# Patient Record
Sex: Female | Born: 1980 | Race: Black or African American | Hispanic: No | Marital: Single | Smoking: Never smoker
Health system: Southern US, Community
[De-identification: ages and names within clinical notes are randomized; demographics above are authoritative.]

---

## 2003-03-27 ENCOUNTER — Emergency Department (HOSPITAL_COMMUNITY): Admission: EM | Admit: 2003-03-27 | Discharge: 2003-03-27 | Payer: Self-pay | Admitting: Emergency Medicine

## 2003-03-28 ENCOUNTER — Encounter: Payer: Self-pay | Admitting: Emergency Medicine

## 2003-03-28 ENCOUNTER — Emergency Department (HOSPITAL_COMMUNITY): Admission: EM | Admit: 2003-03-28 | Discharge: 2003-03-28 | Payer: Self-pay | Admitting: Emergency Medicine

## 2003-10-15 ENCOUNTER — Emergency Department (HOSPITAL_COMMUNITY): Admission: EM | Admit: 2003-10-15 | Discharge: 2003-10-15 | Payer: Self-pay | Admitting: Emergency Medicine

## 2003-10-15 ENCOUNTER — Encounter: Payer: Self-pay | Admitting: Emergency Medicine

## 2004-03-19 ENCOUNTER — Emergency Department (HOSPITAL_COMMUNITY): Admission: EM | Admit: 2004-03-19 | Discharge: 2004-03-19 | Payer: Self-pay | Admitting: Emergency Medicine

## 2005-02-25 ENCOUNTER — Emergency Department (HOSPITAL_COMMUNITY): Admission: EM | Admit: 2005-02-25 | Discharge: 2005-02-25 | Payer: Self-pay | Admitting: Emergency Medicine

## 2005-09-05 ENCOUNTER — Emergency Department (HOSPITAL_COMMUNITY): Admission: EM | Admit: 2005-09-05 | Discharge: 2005-09-05 | Payer: Self-pay | Admitting: Emergency Medicine

## 2005-11-17 ENCOUNTER — Emergency Department (HOSPITAL_COMMUNITY): Admission: EM | Admit: 2005-11-17 | Discharge: 2005-11-17 | Payer: Self-pay | Admitting: Emergency Medicine

## 2006-01-20 ENCOUNTER — Emergency Department (HOSPITAL_COMMUNITY): Admission: EM | Admit: 2006-01-20 | Discharge: 2006-01-21 | Payer: Self-pay | Admitting: Emergency Medicine

## 2006-01-20 ENCOUNTER — Emergency Department (HOSPITAL_COMMUNITY): Admission: EM | Admit: 2006-01-20 | Discharge: 2006-01-20 | Payer: Self-pay | Admitting: Emergency Medicine

## 2006-01-22 ENCOUNTER — Inpatient Hospital Stay (HOSPITAL_COMMUNITY): Admission: AD | Admit: 2006-01-22 | Discharge: 2006-01-25 | Payer: Self-pay | Admitting: Internal Medicine

## 2006-01-27 ENCOUNTER — Inpatient Hospital Stay (HOSPITAL_COMMUNITY): Admission: EM | Admit: 2006-01-27 | Discharge: 2006-01-29 | Payer: Self-pay | Admitting: Emergency Medicine

## 2006-01-28 ENCOUNTER — Encounter: Payer: Self-pay | Admitting: Internal Medicine

## 2006-01-30 ENCOUNTER — Inpatient Hospital Stay (HOSPITAL_COMMUNITY): Admission: EM | Admit: 2006-01-30 | Discharge: 2006-02-03 | Payer: Self-pay | Admitting: Emergency Medicine

## 2006-01-31 ENCOUNTER — Encounter (INDEPENDENT_AMBULATORY_CARE_PROVIDER_SITE_OTHER): Payer: Self-pay | Admitting: Specialist

## 2006-09-02 IMAGING — CR DG ABD PORTABLE 1V
1 series · 1 of 1 positions shown · non-contrast
Comparison: [HOSPITAL] abdominal CT dated 01/28/06.

CLINICAL DATA: Nausea, vomiting, and nasogastric tube placement.
PORTABLE ABDOMEN ? 1 VIEW ? 01/30/06:

[view not recorded]
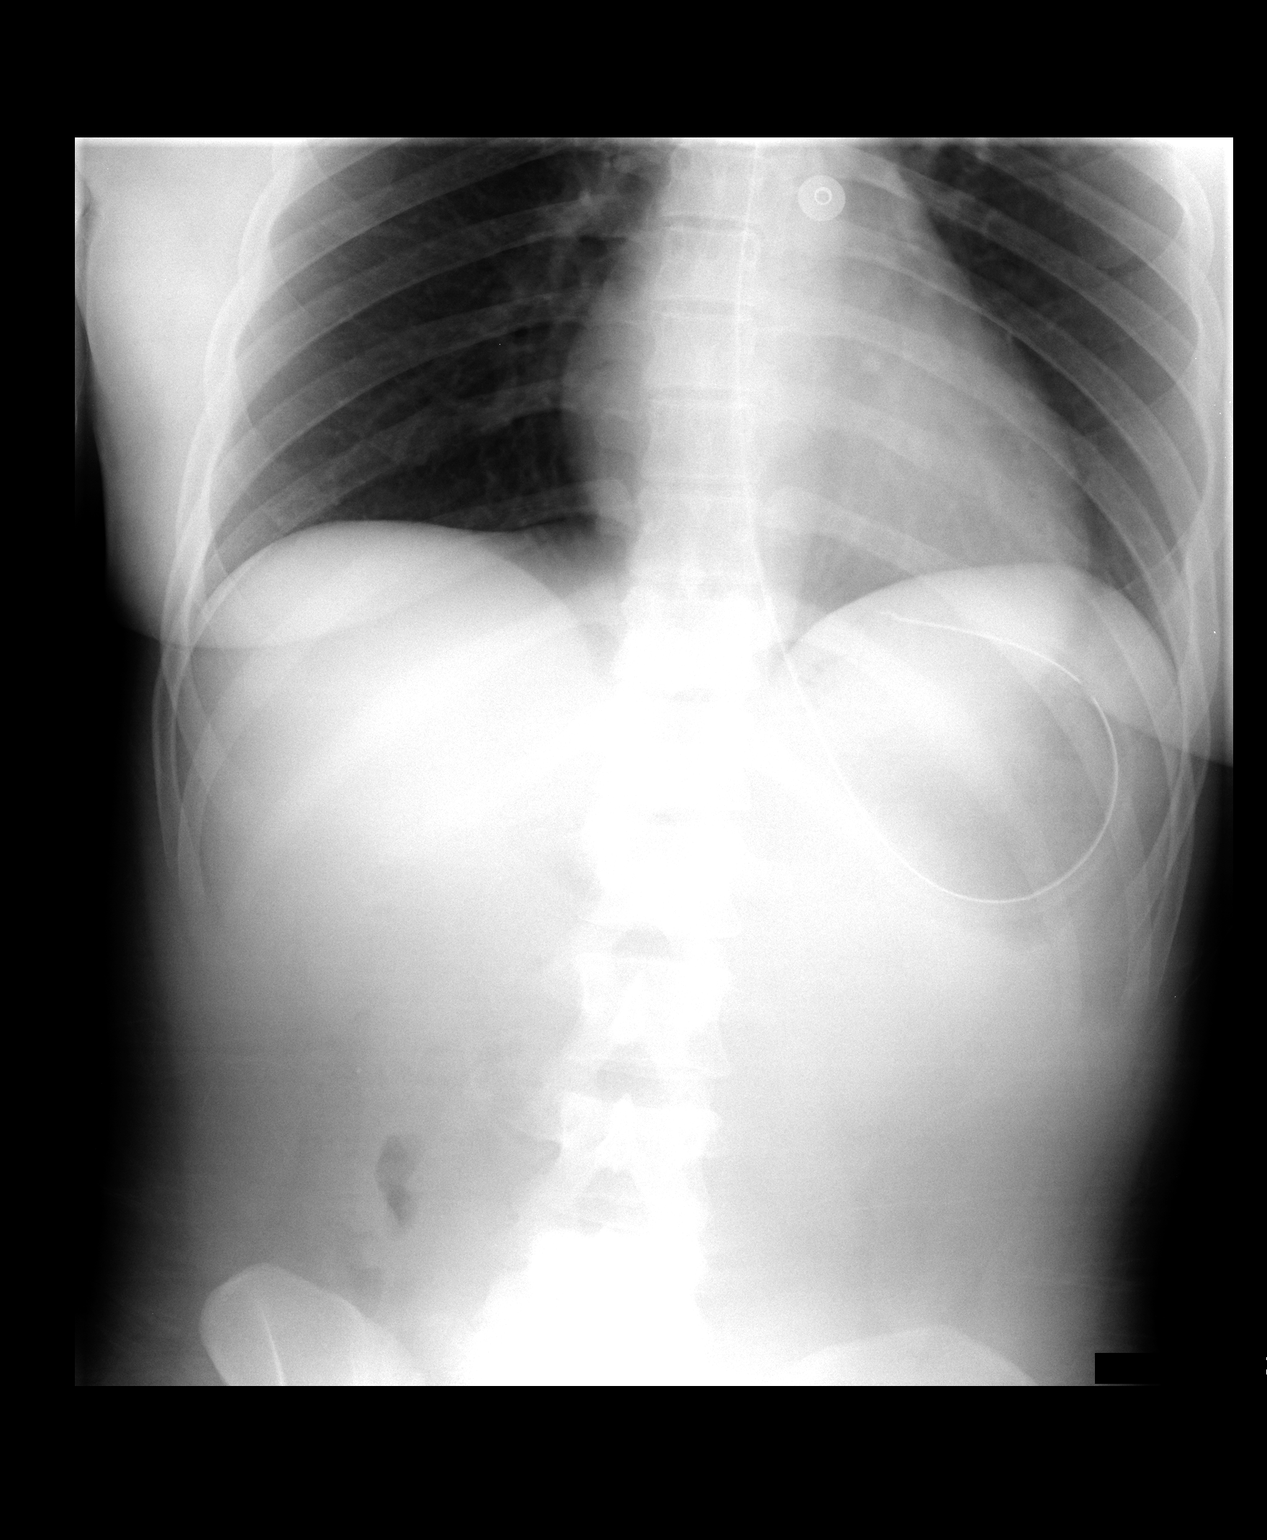

[1 of 1 positions shown; findings below may reference images not displayed]

FINDINGS: A nasogastric tube is seen entering the stomach with loop and ending at gastric cardia.  No other significant abnormality is seen.
IMPRESSION: Nasogastric tube ending in stomach.

## 2006-09-04 ENCOUNTER — Emergency Department (HOSPITAL_COMMUNITY): Admission: EM | Admit: 2006-09-04 | Discharge: 2006-09-04 | Payer: Self-pay | Admitting: Emergency Medicine

## 2006-09-06 ENCOUNTER — Emergency Department (HOSPITAL_COMMUNITY): Admission: EM | Admit: 2006-09-06 | Discharge: 2006-09-06 | Payer: Self-pay | Admitting: Emergency Medicine

## 2006-09-06 ENCOUNTER — Emergency Department (HOSPITAL_COMMUNITY): Admission: EM | Admit: 2006-09-06 | Discharge: 2006-09-07 | Payer: Self-pay | Admitting: *Deleted

## 2007-02-17 ENCOUNTER — Emergency Department (HOSPITAL_COMMUNITY): Admission: EM | Admit: 2007-02-17 | Discharge: 2007-02-17 | Payer: Self-pay | Admitting: Emergency Medicine

## 2008-01-14 ENCOUNTER — Emergency Department (HOSPITAL_COMMUNITY): Admission: EM | Admit: 2008-01-14 | Discharge: 2008-01-14 | Payer: Self-pay | Admitting: Emergency Medicine

## 2008-04-15 ENCOUNTER — Emergency Department (HOSPITAL_COMMUNITY): Admission: EM | Admit: 2008-04-15 | Discharge: 2008-04-15 | Payer: Self-pay | Admitting: Emergency Medicine

## 2008-06-18 ENCOUNTER — Emergency Department (HOSPITAL_COMMUNITY): Admission: EM | Admit: 2008-06-18 | Discharge: 2008-06-18 | Payer: Self-pay | Admitting: Emergency Medicine

## 2008-06-19 ENCOUNTER — Emergency Department (HOSPITAL_COMMUNITY): Admission: EM | Admit: 2008-06-19 | Discharge: 2008-06-19 | Payer: Self-pay | Admitting: Emergency Medicine

## 2008-06-23 ENCOUNTER — Emergency Department (HOSPITAL_COMMUNITY): Admission: EM | Admit: 2008-06-23 | Discharge: 2008-06-23 | Payer: Self-pay | Admitting: Emergency Medicine

## 2008-09-23 ENCOUNTER — Emergency Department (HOSPITAL_COMMUNITY): Admission: EM | Admit: 2008-09-23 | Discharge: 2008-09-23 | Payer: Self-pay | Admitting: Emergency Medicine

## 2009-01-02 ENCOUNTER — Inpatient Hospital Stay (HOSPITAL_COMMUNITY): Admission: EM | Admit: 2009-01-02 | Discharge: 2009-01-03 | Payer: Self-pay | Admitting: Emergency Medicine

## 2009-03-19 ENCOUNTER — Emergency Department (HOSPITAL_COMMUNITY): Admission: EM | Admit: 2009-03-19 | Discharge: 2009-03-19 | Payer: Self-pay | Admitting: Emergency Medicine

## 2009-03-30 ENCOUNTER — Emergency Department (HOSPITAL_COMMUNITY): Admission: EM | Admit: 2009-03-30 | Discharge: 2009-03-30 | Payer: Self-pay | Admitting: Emergency Medicine

## 2009-12-16 ENCOUNTER — Emergency Department (HOSPITAL_COMMUNITY): Admission: EM | Admit: 2009-12-16 | Discharge: 2009-12-16 | Payer: Self-pay | Admitting: Emergency Medicine

## 2011-01-20 ENCOUNTER — Encounter: Payer: Self-pay | Admitting: Internal Medicine

## 2011-03-30 ENCOUNTER — Emergency Department (HOSPITAL_COMMUNITY): Payer: Self-pay

## 2011-03-30 ENCOUNTER — Emergency Department (HOSPITAL_COMMUNITY)
Admission: EM | Admit: 2011-03-30 | Discharge: 2011-03-30 | Disposition: A | Discharge status: Home / Self Care | Payer: Self-pay | Attending: Emergency Medicine | Admitting: Emergency Medicine

## 2011-03-30 DIAGNOSIS — R109 Unspecified abdominal pain: Secondary | ICD-10-CM | POA: Insufficient documentation

## 2011-03-30 DIAGNOSIS — R51 Headache: Secondary | ICD-10-CM | POA: Insufficient documentation

## 2011-03-30 DIAGNOSIS — N390 Urinary tract infection, site not specified: Secondary | ICD-10-CM | POA: Insufficient documentation

## 2011-03-30 DIAGNOSIS — R112 Nausea with vomiting, unspecified: Secondary | ICD-10-CM | POA: Insufficient documentation

## 2011-03-30 LAB — URINE MICROSCOPIC-ADD ON

## 2011-03-30 LAB — COMPREHENSIVE METABOLIC PANEL
ALT: 23 U/L (ref 0–35)
Calcium: 9.7 mg/dL (ref 8.4–10.5)
Creatinine, Ser: 0.92 mg/dL (ref 0.4–1.2)
Glucose, Bld: 113 mg/dL — ABNORMAL HIGH (ref 70–99)
Sodium: 140 mEq/L (ref 135–145)
Total Protein: 7.7 g/dL (ref 6.0–8.3)

## 2011-03-30 LAB — DIFFERENTIAL
Basophils Absolute: 0 10*3/uL (ref 0.0–0.1)
Neutro Abs: 2.3 10*3/uL (ref 1.7–7.7)
Neutrophils Relative %: 53 % (ref 43–77)

## 2011-03-30 LAB — URINALYSIS, ROUTINE W REFLEX MICROSCOPIC
Nitrite: NEGATIVE
Specific Gravity, Urine: 1.02 (ref 1.005–1.030)
Urobilinogen, UA: 0.2 mg/dL (ref 0.0–1.0)
pH: 8 (ref 5.0–8.0)

## 2011-03-30 LAB — CBC
MCHC: 32.7 g/dL (ref 30.0–36.0)
MCV: 89.5 fL (ref 78.0–100.0)
RDW: 12.9 % (ref 11.5–15.5)
WBC: 4.3 10*3/uL (ref 4.0–10.5)

## 2011-03-30 LAB — LIPASE, BLOOD: Lipase: 16 U/L (ref 11–59)

## 2011-04-01 LAB — DIFFERENTIAL
Basophils Relative: 3 % — ABNORMAL HIGH (ref 0–1)
Lymphs Abs: 1.5 10*3/uL (ref 0.7–4.0)
Monocytes Absolute: 0.4 10*3/uL (ref 0.1–1.0)
Monocytes Relative: 7 % (ref 3–12)
Neutro Abs: 3.8 10*3/uL (ref 1.7–7.7)
Neutrophils Relative %: 65 % (ref 43–77)

## 2011-04-01 LAB — CBC
HCT: 39.8 % (ref 36.0–46.0)
MCHC: 33.4 g/dL (ref 30.0–36.0)
Platelets: 237 10*3/uL (ref 150–400)
RDW: 11.9 % (ref 11.5–15.5)

## 2011-04-01 LAB — COMPREHENSIVE METABOLIC PANEL
Albumin: 4.3 g/dL (ref 3.5–5.2)
Alkaline Phosphatase: 49 U/L (ref 39–117)
BUN: 12 mg/dL (ref 6–23)
Calcium: 9.5 mg/dL (ref 8.4–10.5)
Glucose, Bld: 156 mg/dL — ABNORMAL HIGH (ref 70–99)
Potassium: 3.7 mEq/L (ref 3.5–5.1)
Sodium: 144 mEq/L (ref 135–145)
Total Protein: 7.7 g/dL (ref 6.0–8.3)

## 2011-04-01 LAB — URINE CULTURE

## 2011-04-01 LAB — URINE MICROSCOPIC-ADD ON

## 2011-04-01 LAB — POCT PREGNANCY, URINE: Preg Test, Ur: NEGATIVE

## 2011-04-01 LAB — URINALYSIS, ROUTINE W REFLEX MICROSCOPIC
Glucose, UA: NEGATIVE mg/dL
Leukocytes, UA: NEGATIVE
Nitrite: NEGATIVE
pH: 7.5 (ref 5.0–8.0)

## 2011-04-10 LAB — COMPREHENSIVE METABOLIC PANEL
ALT: 31 U/L (ref 0–35)
Alkaline Phosphatase: 50 U/L (ref 39–117)
BUN: 11 mg/dL (ref 6–23)
Chloride: 112 mEq/L (ref 96–112)
Glucose, Bld: 119 mg/dL — ABNORMAL HIGH (ref 70–99)
Potassium: 3.8 mEq/L (ref 3.5–5.1)
Sodium: 142 mEq/L (ref 135–145)
Total Bilirubin: 0.6 mg/dL (ref 0.3–1.2)
Total Protein: 6.5 g/dL (ref 6.0–8.3)

## 2011-04-10 LAB — URINALYSIS, ROUTINE W REFLEX MICROSCOPIC
Glucose, UA: NEGATIVE mg/dL
Nitrite: NEGATIVE
Specific Gravity, Urine: 1.021 (ref 1.005–1.030)
pH: 7 (ref 5.0–8.0)

## 2011-04-10 LAB — CBC
HCT: 37.8 % (ref 36.0–46.0)
Hemoglobin: 12.7 g/dL (ref 12.0–15.0)
RBC: 4.17 MIL/uL (ref 3.87–5.11)
RDW: 12.2 % (ref 11.5–15.5)
WBC: 7.3 10*3/uL (ref 4.0–10.5)

## 2011-04-10 LAB — DIFFERENTIAL
Basophils Absolute: 0.2 10*3/uL — ABNORMAL HIGH (ref 0.0–0.1)
Basophils Relative: 3 % — ABNORMAL HIGH (ref 0–1)
Eosinophils Absolute: 0 10*3/uL (ref 0.0–0.7)
Monocytes Absolute: 0.5 10*3/uL (ref 0.1–1.0)
Monocytes Relative: 6 % (ref 3–12)
Neutrophils Relative %: 69 % (ref 43–77)

## 2011-04-10 LAB — PREGNANCY, URINE: Preg Test, Ur: NEGATIVE

## 2011-04-11 LAB — DIFFERENTIAL
Basophils Absolute: 0 K/uL (ref 0.0–0.1)
Basophils Relative: 0 % (ref 0–1)
Eosinophils Absolute: 0 K/uL (ref 0.0–0.7)
Eosinophils Relative: 0 % (ref 0–5)
Lymphocytes Relative: 24 % (ref 12–46)
Lymphs Abs: 1.6 K/uL (ref 0.7–4.0)
Monocytes Absolute: 0.6 K/uL (ref 0.1–1.0)
Monocytes Relative: 9 % (ref 3–12)
Neutro Abs: 4.5 K/uL (ref 1.7–7.7)
Neutrophils Relative %: 67 % (ref 43–77)

## 2011-04-11 LAB — CBC
HCT: 41.6 % (ref 36.0–46.0)
Hemoglobin: 13.8 g/dL (ref 12.0–15.0)
MCHC: 33.1 g/dL (ref 30.0–36.0)
MCV: 92 fL (ref 78.0–100.0)
Platelets: 223 K/uL (ref 150–400)
RBC: 4.52 MIL/uL (ref 3.87–5.11)
RDW: 12.9 % (ref 11.5–15.5)
WBC: 6.8 K/uL (ref 4.0–10.5)

## 2011-04-11 LAB — COMPREHENSIVE METABOLIC PANEL WITH GFR
ALT: 25 U/L (ref 0–35)
AST: 32 U/L (ref 0–37)
Alkaline Phosphatase: 60 U/L (ref 39–117)
CO2: 24 meq/L (ref 19–32)
Calcium: 9.8 mg/dL (ref 8.4–10.5)
Chloride: 103 meq/L (ref 96–112)
GFR calc non Af Amer: >60 mL/min (ref >60)
Glucose, Bld: 112 mg/dL — ABNORMAL HIGH (ref 70–99)
Potassium: 3.8 meq/L (ref 3.5–5.1)
Sodium: 138 meq/L (ref 135–145)
Total Bilirubin: 0.5 mg/dL (ref 0.3–1.2)

## 2011-04-11 LAB — URINALYSIS, ROUTINE W REFLEX MICROSCOPIC
Bilirubin Urine: NEGATIVE
Glucose, UA: NEGATIVE mg/dL
Hgb urine dipstick: NEGATIVE
Ketones, ur: NEGATIVE mg/dL
Leukocytes, UA: NEGATIVE
Nitrite: NEGATIVE
Protein, ur: 30 mg/dL — AB
Specific Gravity, Urine: 1.022 (ref 1.005–1.030)
Urobilinogen, UA: 0.2 mg/dL (ref 0.0–1.0)
pH: 8.5 — ABNORMAL HIGH (ref 5.0–8.0)

## 2011-04-11 LAB — COMPREHENSIVE METABOLIC PANEL
Albumin: 4.6 g/dL (ref 3.5–5.2)
BUN: 8 mg/dL (ref 6–23)
Creatinine, Ser: 0.91 mg/dL (ref 0.4–1.2)
GFR calc Af Amer: >60 mL/min (ref >60)
Total Protein: 8 g/dL (ref 6.0–8.3)

## 2011-04-11 LAB — LIPASE, BLOOD: Lipase: 65 U/L — ABNORMAL HIGH (ref 11–59)

## 2011-04-11 LAB — URINE MICROSCOPIC-ADD ON

## 2011-04-11 LAB — PREGNANCY, URINE: Preg Test, Ur: NEGATIVE

## 2011-04-15 LAB — COMPREHENSIVE METABOLIC PANEL
ALT: 43 U/L — ABNORMAL HIGH (ref 0–35)
AST: 31 U/L (ref 0–37)
Albumin: 5.1 g/dL (ref 3.5–5.2)
Calcium: 10 mg/dL (ref 8.4–10.5)
Creatinine, Ser: 0.93 mg/dL (ref 0.4–1.2)
GFR calc Af Amer: >60 mL/min (ref >60)
GFR calc non Af Amer: >60 mL/min (ref >60)
Sodium: 140 mEq/L (ref 135–145)
Total Protein: 8.4 g/dL — ABNORMAL HIGH (ref 6.0–8.3)

## 2011-04-15 LAB — PHOSPHORUS: Phosphorus: 2.9 mg/dL (ref 2.3–4.6)

## 2011-04-15 LAB — DIFFERENTIAL
Eosinophils Absolute: 0 10*3/uL (ref 0.0–0.7)
Eosinophils Relative: 0 % (ref 0–5)
Lymphocytes Relative: 12 % (ref 12–46)
Lymphs Abs: 1 10*3/uL (ref 0.7–4.0)
Monocytes Relative: 11 % (ref 3–12)

## 2011-04-15 LAB — BASIC METABOLIC PANEL
BUN: 16 mg/dL (ref 6–23)
BUN: 16 mg/dL (ref 6–23)
CO2: 25 mEq/L (ref 19–32)
CO2: 26 mEq/L (ref 19–32)
Calcium: 8.4 mg/dL (ref 8.4–10.5)
Chloride: 104 mEq/L (ref 96–112)
Glucose, Bld: 85 mg/dL (ref 70–99)
Glucose, Bld: 90 mg/dL (ref 70–99)
Potassium: 2.9 mEq/L — ABNORMAL LOW (ref 3.5–5.1)
Sodium: 138 mEq/L (ref 135–145)
Sodium: 140 mEq/L (ref 135–145)

## 2011-04-15 LAB — CBC
HCT: 37.3 % (ref 36.0–46.0)
Hemoglobin: 12 g/dL (ref 12.0–15.0)
MCHC: 32.3 g/dL (ref 30.0–36.0)
MCHC: 32.8 g/dL (ref 30.0–36.0)
MCV: 91.8 fL (ref 78.0–100.0)
Platelets: 159 10*3/uL (ref 150–400)
Platelets: 192 10*3/uL (ref 150–400)
RBC: 4.07 MIL/uL (ref 3.87–5.11)
RBC: 4.68 MIL/uL (ref 3.87–5.11)
RDW: 12.5 % (ref 11.5–15.5)
RDW: 13.5 % (ref 11.5–15.5)

## 2011-04-15 LAB — URINALYSIS, ROUTINE W REFLEX MICROSCOPIC
Bilirubin Urine: NEGATIVE
Glucose, UA: NEGATIVE mg/dL
Ketones, ur: >80 mg/dL — AB
Leukocytes, UA: NEGATIVE
Nitrite: NEGATIVE
Specific Gravity, Urine: 1.031 — ABNORMAL HIGH (ref 1.005–1.030)
pH: 7 (ref 5.0–8.0)

## 2011-04-15 LAB — CROSSMATCH
ABO/RH(D): A POS
Antibody Screen: NEGATIVE

## 2011-04-15 LAB — URINE MICROSCOPIC-ADD ON

## 2011-04-15 LAB — CULTURE, BLOOD (ROUTINE X 2): Culture: NO GROWTH

## 2011-04-15 LAB — RAPID URINE DRUG SCREEN, HOSP PERFORMED
Amphetamines: NOT DETECTED
Benzodiazepines: NOT DETECTED

## 2011-04-15 LAB — CARDIAC PANEL(CRET KIN+CKTOT+MB+TROPI)
CK, MB: 0.6 ng/mL (ref 0.3–4.0)
Total CK: 60 U/L (ref 7–177)
Total CK: 65 U/L (ref 7–177)
Troponin I: <0.01 ng/mL (ref 0.00–0.06)

## 2011-04-15 LAB — GC/CHLAMYDIA PROBE AMP, URINE: GC Probe Amp, Urine: NEGATIVE

## 2011-04-15 LAB — CK TOTAL AND CKMB (NOT AT ARMC)
CK, MB: 0.6 ng/mL (ref 0.3–4.0)
Relative Index: INVALID (ref 0.0–2.5)
Total CK: 79 U/L (ref 7–177)

## 2011-04-15 LAB — HIV ANTIBODY (ROUTINE TESTING W REFLEX): HIV: NONREACTIVE

## 2011-04-15 LAB — TROPONIN I: Troponin I: 0.01 ng/mL (ref 0.00–0.06)

## 2011-05-14 NOTE — H&P (Signed)
Marie George, Marie George              ACCOUNT NO.:  [id number]   MEDICAL RECORD NO.:  [id number]          PATIENT TYPE:  INP   LOCATION:  1422                         FACILITY:  [facility]   PHYSICIAN:  Vanetta Mulders, MD         DATE OF BIRTH:  Feb 02, 1981   DATE OF ADMISSION:  01/02/2009  DATE OF DISCHARGE:                              HISTORY & PHYSICAL   CHIEF COMPLAINT:  Abdominal pain.   PRIMARY CARE PHYSICIAN:  Patient is unassigned.   HISTORY OF PRESENT ILLNESS:  [name] is a 30 year old lady with a  history of multiple visits to the ED for abdominal pain and alcohol  binges, who comes in today for 4 days of abdominal pain, nausea,  vomiting, and diarrhea.  The patient states that she had drank a lot on  the night of New Year's Eve, and in the morning, she felt nauseated.  She vomited multiple times.   The patient vomited clear, watery stomach contents and could not keep  anything down.  Her pain did not subside, was predominantly epigastric  with radiation to the back and to the lower abdomen.  The patient also  started having chest pain associated with the vomiting, retrosternal  with radiation to the back, increased with deep breathing, not  associated with shortness of breath or palpitations.  The patient admits  having a sore throat, denies having cough or sputum production.  Denies  being short of breath.  She admits having a vaginal discharge, white-  yellow.  She has had unprotected sexual intercourse on the night of New  Year's Eve following vaginal discharge.   ALLERGIES:  Patient has no known drug allergies.   PAST MEDICAL HISTORY:  1. Significant for multiple visits to the ED for abdominal pain with      negative CAT scan.  2. History of alcohol abuse and alcohol binges.  3. History of marijuana use.   FAMILY HISTORY:  Noncontributory.   SOCIAL HISTORY:  Patient is a smoker.  She drinks alcohol extensively,  and she uses marijuana.  Denies using cocaine.   MEDICATIONS:  None.   REVIEW OF SYSTEMS:  Positive for history of present illness and  otherwise negative for pertinent.   PHYSICAL EXAMINATION:  Temperature 98.3, heart rate 55-70, blood  pressure 131/87 to 158/90.  Respiratory rate 16-18.  Oxygen saturation  98-100% on room air.  GENERAL:  Patient is slightly sleep, complaining of abdominal pain, but  seems to be in no acute distress.  Eyes are PERRL.  Extraocular movements are intact.  Oropharynx:  Slight  erythema.  NECK: No thyromegaly  LYMPHADENOPATHY: Some mandibular lymphadenopathy, 2-3 lymph nodes  bilaterally, 0.5 to 1 cm, nontender and mobile.  No lateral cervical  adenopathy or retroauricular.  No axillary lymphadenopathy.  Positive  supraclavicular lymphadenopathy on the right side, 1cm lymph node,non  tender, mobile.  CARDIOVASCULAR:  Regular rate and rhythm.  No murmurs, rubs or gallops.  RESPIRATORY:  Good air movement.  No wheezing.  .  ABDOMEN:  Bowel sounds are absent.  Soft.  Tender to palpation  throughout.  Murphy's positive.  No guarding.  Positive rebound.  LEGS:  No edema.  NEUROLOGIC:  Nonfocal.  PSYCHOLOGICAL:  Alert and oriented.  Slightly sleepy but very easily  arousable.   LABORATORY DATA:  UA shows a few yeast but does show squamous epithelial  cells, negative nitrite, and leukocyte esterase.  Troponin 0.01.  CMP:  Sodium 140, potassium 3, chloride 101, bicarb 27, glucose 102, BUN 18,  creatinine 0.93, bilirubin 1.1, alkaline phosphatase 56, AST 31, ALT 43,  total protein 8.4, albumin 5.1.  Urine pregnancy negative.  CBC:  White  count 8.8, hemoglobin 14.1, platelet count 192.   Chest x-ray showed no acute disease.   ASSESSMENT/PLAN:  1. Abdominal pain, nausea, vomiting, and diarrhea with last loose      stool on Saturday, 3 days prior to admission.  Differential      diagnosis at this time is very broad.  Lipase, abdominal      ultrasound, CT abdomen, stool studies, and blood cultures are       pending.  CMP shows a slight elevation in ALT and is not consistent      with a biliary problem, but this cannot entirely be ruled out      without a RUQ ultrasound.  2. Chest pain, unsure etiology:  Patient is a 30 year old.  There are      no acute changes on the EKG.  Troponin and cardiac enzymes are so      far negative, and subsequent studies are pending.  I think the      patient's chest pain is related to esophagitis from retching and      persistent nausea and vomiting.  3. Sore throat:  Will check a rapid strep, since the patient complains      of a sore throat and has lymphadenopathy.  4. Lymphadenopathy:  Unsure of etiology.  With patient's history of      unprotected sexual intercourse, it is worrisome that this is an      infectious disease.  HIV and RPR are pending.  5. Unprotected sexual intercourse:  Urine GC and Chlamydia antigen are      pending.  6. Substance abuse:  Advise abstinence and counseling.  7. Prophylaxis:  Protonix IV.   For now, we are going to continue supportive care with antiemetics and  anti-antalgic medications.  Follow up on the studies and start  ciprofloxacin for possible gastroenteritis.      Vanetta Mulders, MD  Electronically Signed     DA/MEDQ  D:  01/02/2009  T:  01/03/2009  Job:  [id number]

## 2011-05-14 NOTE — Discharge Summary (Signed)
Marie George, Marie George              ACCOUNT NO.:  [id number]   MEDICAL RECORD NO.:  [id number]          PATIENT TYPE:  INP   LOCATION:  1422                         FACILITY:  [facility]   PHYSICIAN:  Hillery Aldo, M.D.   DATE OF BIRTH:  1981/02/13   DATE OF ADMISSION:  01/02/2009  DATE OF DISCHARGE:  01/03/2009                               DISCHARGE SUMMARY   PRIMARY CARE PHYSICIAN:  Unassigned.   DISCHARGE DIAGNOSES:  1. Self-limited gastritis, likely alcohol induced.  2. Abdominal pain, nausea and vomiting secondary to #1.  3. Fatty liver.  4. Binge drinking and alcohol abuse.  5. Hypokalemia.  6. Unprotected sexual intercourse history.   DISCHARGE MEDICATIONS:  1. Phenergan 25 mg q.6 h p.r.n.  2. Prilosec over-the-counter 20 mg daily p.r.n. reflux symptoms.   CONSULTATIONS:  None.   BRIEF ADMISSION HISTORY OF PRESENT ILLNESS:  The patient is a 30-year-  old female who presented to the hospital for evaluation of abdominal  pain accompanied by nausea, vomiting and diarrhea.  Symptoms have been  persistent since New Year's Eve when she had an excessive amount of  alcohol to drink.  She was admitted for further evaluation and workup.  For the full details, please see the dictated report done by Dr. Frederico Hamman.   PROCEDURES AND DIAGNOSTIC STUDIES.:  1. Chest x-ray on December 30, 2008, showed no acute disease in the      chest.  2. Abdominal ultrasound on January 02, 2009, showed no evidence of      gallstones or biliary ductal dilatation.  Diffuse fatty      infiltration of the liver, 1.1 cm hypoechoic lesion in the      posterior hepatic lobe.  3. CT scan of the abdomen and pelvis on January 02, 2009, showed no      acute intra-abdominal findings.  Mild diffuse fatty infiltration of      the liver as well as focal fatty infiltration adjacent to the      falciform ligament.  No liver mass identified.  In the pelvis,      there was a small amount of free fluid in the pelvic  cul-de-sac.      This was felt to be nonspecific but could be physiologic in      menstrual age female.  Otherwise unremarkable.   DISCHARGE LABORATORY VALUES:  White blood cell count was 5.8, hemoglobin  12.2, hematocrit 37.8, platelets 159.  HIV testing was negative.  RPR  was nonreactive.  Phosphorus was 2.9, magnesium 2.5.  Cardiac markers  were negative x3 sets.  Sodium was 138, potassium 3.4 (repleted),  chloride 103, bicarb 26, BUN 16, creatinine 0.94, glucose 85.  Lipase  was 12.  Urine drug screen was positive for tetrahydrocannabinol.  Liver  function studies were within normal limits with the exception of mildly  elevated ALT of 43.   HOSPITAL COURSE:  #1.  ABDOMINAL PAIN WITH NAUSEA, VOMITING, DIARRHEA SECONDARY TO ALCOHOL-  INDUCED GASTRITIS:  The patient's symptoms were self-limited and  improved with IV fluid rehydration and administration of antiemetics.  Her diet was advanced, and  she was tolerating a regular diet with no  return of abdominal pain, nausea, vomiting or diarrheal-type symptoms.  She is advised to avoid heavy binge drinking in the future.  She was  provided with a prescription for Phenergan to use p.r.n.   #2.  FATTY LIVER:  Likely due to obesity and alcohol intake.  The  patient was advised to decrease her alcohol consumption.   #3[place] DRINKING:  The patient will be referred to [facility] on discharge.   #4.  HYPOKALEMIA:  The patient was appropriately repleted.   #5.  UNPROTECTED SEXUAL INTERCOURSE HISTORY:  The patient did have HIV  an RPR tests done and were negative.  GC and chlamydia  testing is  pending at the time of this dictation.  She will be notified if test  results if return positive.   DISPOSITION:  The patient is medically stable and will be discharged  home.  She should follow up with a physician for any non-resolution of  symptoms.   Time spent coordinating care for discharge and discharge instructions  equals 35 minutes.       Hillery Aldo, M.D.  Electronically Signed     CR/MEDQ  D:  01/03/2009  T:  01/03/2009  Job:  [id number]

## 2011-05-17 NOTE — Consult Note (Signed)
Marie George, Marie George              ACCOUNT NO.:  [id number]   MEDICAL RECORD NO.:  [id number]          PATIENT TYPE:  INP   LOCATION:  6703                         FACILITY:  [facility]   PHYSICIAN:  Jordan Hawks. Elnoria Howard, MD    DATE OF BIRTH:  05/28/1981   DATE OF CONSULTATION:  01/30/2006  DATE OF DISCHARGE:                                   CONSULTATION   REASON FOR CONSULTATION:  Persistent nausea and vomiting.   REFERRING PHYSICIAN:  [facility].   HISTORY OF PRESENT ILLNESS:  This is a 30 year old black female without any  significant past medical history, who re-presents to the emergency room with  persistent nausea and vomiting.  The patient has had two prior evaluations  in the emergency room for her complaints and one hospitalization during the  evaluation.  At this time she has an NG tube placed and she feels that it is  very difficult for her to give a verbal history; however, the prior history  was obtained from the current notes and it states that she has had this  recurrent nausea and vomiting that started acutely two days ago.  There is a  possibility of food poisoning; in fact, that was the initial diagnosis at  the time and she was subsequently discharged from the emergency room without  further evaluation.  Unfortunately, her symptoms continued to persist.  She  was admitted at the end of January and at that time, she was treated with IV  hydration and antiemetics with resolution of her symptoms.  She subsequently  was provided with some medications and discharged home; however, she did not  take the medications and her symptoms did recur.  She is now again here in  the emergency room and there is the possibility that she had gone to [facility]  also with the same type of complaint.  An extensive workup was performed  with blood work, which was negative for any pancreatitis or any abdominal-  pelvic abnormality on CT scan.  The CT scan was also negative for any  pancreatitis.  The patient denies any types of new p.o. intake and she  denies any history of dysphagia or GERD.  The nausea and vomiting started  acutely, and she is uncertain about any precipitating factors.  She denies  any hematemesis at this time.   Past medical history and past surgical history is as stated above.   FAMILY HISTORY:  Noncontributory.   SOCIAL HISTORY:  The patient works as a Horticulturist, commercial.  She does use marijuana.  Denies any IV drug abuse and does have tattoos.   REVIEW OF SYSTEMS:  Unable to adequately obtain, as the patient was not  volunteering at this time.   MEDICATIONS:  None.   ALLERGIES:  None.   PHYSICAL EXAMINATION:  VITAL SIGNS:  Stable.  GENERAL:  The patient is in mild to moderate distress.  HEENT:  Normocephalic, atraumatic.  Extraocular muscles intact.  Pupils  equal, round, and reactive to light.  NECK:  Supple, no lymphadenopathy.  LUNGS:  Clear to auscultation bilaterally.  CARDIOVASCULAR:  Regular rate and  rhythm, without murmurs, gallops or rubs.  ABDOMEN:  The patient has refused an examination at this time; however, she  does point to pain in her abdomen.  EXTREMITIES:  No clubbing, cyanosis, or edema.   LABORATORY VALUES:  On January 30, 2006, her white blood cell count is 2.9,  hemoglobin is 12.8, and platelets of 326.  Sodium is 136, potassium 3.2,  chloride is 103, CO2 23, BUN is 10, creatinine is 1.0, glucose is 117.  On  January 28, 2006, amylase was 47, lipase 16.  On January 27, 2006, AST 24,  ALT 32, alkaline phosphatase is 40, total bilirubin 0.6, albumin is 4.1.  Urinalysis is negative for nitrite and leukocytes, bacteria is rare.  There  is evidence of a significant amount of rbc's.   IMPRESSION:  1.  Persistent nausea and vomiting of unknown etiology.  2.  Abdominal pain.  3.  Hemoglobinuria.   At this time I am uncertain about the patient's etiology.  I have reviewed  all of the prior records and, in fact, the laboratory  values from 2005 where  was evaluated in the emergency room for other types of complaints.  The  urinalysis does reveal that she has persistent red blood cells, and at this  time she denies any history of urinary tract infections or any dysuria or  history of kidney stones.  However, I agree further workup should be pursued  at this time.  As for her nausea and vomiting, it is not unreasonable to  perform an EGD in order to discern any type of etiology.  Additionally, with  the history of persistent nausea and vomiting, a CT scan of the head should  be pursued.  Human immunodeficiency virus is also a consideration, given her  line of work at this time.   PLAN:  1.  EGD tomorrow afternoon.  2.  CT scan of the head with IV contrast.  3.  Check HIV.  4.  Continue with IV hydration.  5.  Continue with antiemetics.      Jordan Hawks Elnoria Howard, MD  Electronically Signed     PDH/MEDQ  D:  01/30/2006  T:  01/31/2006  Job:  [id number]

## 2011-05-17 NOTE — H&P (Signed)
NAMESIDRA, OLDFIELD              ACCOUNT NO.:  [id number]   MEDICAL RECORD NO.:  [id number]          PATIENT TYPE:  EMS   LOCATION:  MAJO                         FACILITY:  [facility]   PHYSICIAN:  Michaelyn Barter, M.D. DATE OF BIRTH:  1981-03-24   DATE OF ADMISSION:  01/30/2006  DATE OF DISCHARGE:                                HISTORY & PHYSICAL   The patient's primary care physician is unassigned.   CHIEF COMPLAINT:  Stomach pain, chest pain.   HISTORY OF PRESENT ILLNESS:  Ms. Grunow is a 30 year old female discharged  from [facility] yesterday after having been treated from January 29-  31, 2007.  At that time she presented with symptoms of nausea, vomiting and  abdominal pain.  Likewise, the patient had also been admitted on January 22, 2006, at which time she complained of nausea, vomiting and abdominal pain.  Following her discharge from the hospital yesterday, she stated that she  felt fine up until this morning.  She says she began to have diarrhea this  morning, having approximately six bowel movements.  She says she also  started having multiple bouts of emesis, which she describes as being a lot  in number.  She says she developed right-sided upper chest pain today and  stated that her chest primarily hurts when she throws up.  She also  describes her abdominal pain as being achy in nature, and the abdominal pain  is constant, and she states that her symptoms are the same as when she was  admitted on January 27, 2006.  She complained of feeling febrile at home and  went on to state that she has been having hot and cold flashes.  She does  not feel that foods trigger her symptoms.  No radiation of abdominal pain to  the back or down below.  Chest pain does not travel down her arm or to the  neck and, again, it is primarily associated with her bouts of emesis.   PAST MEDICAL HISTORY:  1.  Two recent hospitalizations for nausea and vomiting and abdominal pain  in January 2007.  2.  Recent treatment for left lower lobe pneumonia.  3.  History of hypokalemia.   PAST SURGICAL HISTORY:  The patient does not mention any past surgical  history.   HOME MEDICATIONS:  The patient was discharged home:   1.  Potassium chloride 20 mEq once a day.  2.  Phenergan 25 mg q.4h. p.r.n. for nausea.  3.  Multivitamin with iron once a day.   The patient indicated that she had not taken any of her medications since  leaving the hospital.   ALLERGIES:  No known drug allergies.   SOCIAL HISTORY:  Cigarettes.  The patient denies alcohol.  The patient  drinks occasionally.  Street drugs:  The patient smokes marijuana.   FAMILY HISTORY:  Mother has no illnesses.  Father has no illnesses.   REVIEW OF SYSTEMS:  As per HPI, otherwise all other systems are negative.   PHYSICAL EXAMINATION:  GENERAL:  The patient is awake.  She is ill-  appearing.  She has dry heaves in front of me and likewise multiple times  after I leave her room.  She also requests pain medications for her abdomen.  VITAL SIGNS:  Temperature 100.0, blood pressure 148/86, heart rate 90,  respirations 24, O2 saturation 100% on room air.  HEENT:  Normocephalic, atraumatic.  NECK:  Supple, no lymphadenopathy.  Thyroid is not palpable.  CARDIAC:  S1, S2 is present, tachycardic.  RESPIRATORY:  No crackles, no wheezes.  ABDOMEN:  Diffuse tenderness in all four quadrants.  The belly is soft,  hypoactive bowel sounds.  No masses.  EXTREMITIES:  No edema.  NEUROLOGIC:  The patient is alert and oriented x3.  Cranial nerves II-XII  are intact.  MUSCULOSKELETAL:  5/5 upper and lower extremity strength.   LABORATORY DATA:  White blood cell count 2.9, hemoglobin 12.8, hematocrit  38.4, platelets 326.  Sodium 136, potassium 3.2, chloride 103, CO2 23, BUN  11, creatinine 1.1.  Glucose 117.  Bilirubin total 0.9, alkaline phosphatase  47, SGOT 23 and SGPT  1.  total protein 7.3, albumin 4.1, calcium 9.0.   CT of the abdomen was      completed on January 28, 2006, which revealed no intra-abdominal      pathology, no acute pelvic abnormality.   ASSESSMENT AND PLAN:  Ms. Herter is a 30 year old female who has recently  had multiple admissions secondary to a chief complaint of abdominal pain  accompanied by nausea and vomiting.   1.  Abdominal pain, accompanied by nausea and emesis.  The etiology of this      at this particular time is questionable.  The differential is wide and      includes gastritis versus peptic ulcer disease versus gastroenteritis      versus pancreatitis.  The patient has had several recent admissions for      these symptoms and has also been up to [facility].  In light of      the patient having had a recent CT scan of the abdomen that was deemed      completely negative, we will start the evaluation of the patient's      abdomen by ordering an abdominal x-ray.  Will consider CT scan later.      Will go ahead and consult GI.  The patient may need an EGD.  In      addition, will place an NG tube and provide p.r.n. antiemetics for now.      The NG tube, hopefully, will be able to decompress the abdomen and      perhaps decrease some of the irritation that is currently present.  2.  Recent left lower lobe pneumonia.  At this particular time, the patient      does not complain of any cough and there are no obvious signs of      respiratory compromise; however, in light of the fever, we will check a      chest x-ray and will order blood cultures x2.  3.  Hypokalemia.  Will supplement.  4.  GI prophylaxis.  Will provide Protonix.      Michaelyn Barter, M.D.  Electronically Signed     OR/MEDQ  D:  01/30/2006  T:  01/30/2006  Job:  [id number]

## 2011-05-17 NOTE — H&P (Signed)
Marie George, Marie George              ACCOUNT NO.:  [id number]   MEDICAL RECORD NO.:  [id number]          PATIENT TYPE:  INP   LOCATION:  0105                         [facility]ACILITY:  [facility]   PHYSICIAN:  Isidor Holts, M.D.  DATE OF BIRTH:  1981-05-01   DATE OF ADMISSION:  01/22/2006  DATE OF DISCHARGE:  01/21/2006                                HISTORY & PHYSICAL   PRIMARY MEDICAL DOCTOR:  Gentry Fitz.   CHIEF COMPLAINT:  Vomiting, diarrhea, abdominal pain.   HISTORY OF PRESENT ILLNESS:  This is a 30 year old female, with no  significant past medical history. According to her, she ate some pizza on  the night of January 18, 2006. The next morning she developed crampy lower  abdominal pain and persistent vomiting up to about 10 times per day.  Initially, vomitus consisted of food but is now bilious. There was no  hematemesis or coffee grounds. A day later she developed watery diarrhea.  She has no history of recent travel or clustering of cases. She has been to  the emergency department about four times in the last 3 days, was given  nausea medications, but showed no improvement. She has subjective fever  and sweating. Denies respiratory tract symptoms.   PAST MEDICAL HISTORY:  Nothing of note.   MEDICATIONS:  None regular.   ALLERGIES:  No known drug allergies.   SYSTEMS REVIEW:  As per HPI and chief complaint, otherwise, negative. LMP  was January 20, 2006.   SOCIAL HISTORY:  The patient works as a Web designer. She is single,  has no offspring. She is a nonsmoker, nondrinker, has no history of drug  abuse.   FAMILY HISTORY:  Noncontributory.   PHYSICAL EXAMINATION:  VITAL SIGNS:  T-max 100.6, pulse 78 per minute and  regular, respiratory rate 18, blood pressure 138/85 mmHg, pulse oximeter  100% on room air.  GENERAL:  The patient is not in obvious acute distress, although in some  discomfort from abdominal pain and is vomiting intermittently at the time of  this  evaluation. Not short of breath at rest. Alert, communicative.  HEENT:  No clinical pallor, no jaundice, no conjunctival injection. Throat  appears quite clear.  NECK:  Supple, JVP not seen. No palpable lymphadenopathy, no palpable  goiter.  CHEST:  Clinically clear to auscultation. No wheezes, no crackles. Heart  sounds 1 and 2 heard, normal, regular, no murmurs.  ABDOMEN:  Flat, soft, diffusely tender. There is no palpable organomegaly,  no palpable masses, normal bowel sounds.  LOWER EXTREMITIES:  No pitting edema. Palpable peripheral pulses.  MUSCULOSKELETAL:  Quite unremarkable.  CENTRAL NERVOUS SYSTEM:  No focal neurologic deficits on gross examination.   INVESTIGATIONS:  CBC:  WBC 6.4, hemoglobin 12.1, hematocrit 36.3, platelets  169. Electrolytes:  Sodium 136, potassium 3.6, chloride 107, CO2 18, BUN 10,  creatinine 1.0, glucose 176. Chest x-ray dated January 20, 2006, shows mild  peribronchial thickening, no acute pulmonary findings. Abdominal/pelvic CT  scan dated January 20, 2006, was negative for acute intraabdominal  pathology.   ASSESSMENT AND PLAN:  Acute gastroenteritis. Query etiology. This is likely  due to  food poisoning, or may be viral in etiology. We shall admit the  patient, place on bowel rest, antiemetics, analgesics, proton pump inhibitor  treatment. For completeness, will send stool samples for Clostridium  difficile toxin, fecal leukocytes, and microscopy, amylase and lipase  levels. We shall also do urine drug screen.   Further management will depend on clinical course.      Isidor Holts, M.D.  Electronically Signed     CO/MEDQ  D:  01/22/2006  T:  01/22/2006  Job:  [id number]

## 2011-05-17 NOTE — Discharge Summary (Signed)
NAMECADIE, SORCI              ACCOUNT NO.:  [id number]   MEDICAL RECORD NO.:  [id number]          PATIENT TYPE:  INP   LOCATION:  6709                         FACILITY:  [facility]   PHYSICIAN:  Mobolaji B. Bakare, M.D.DATE OF BIRTH:  21-Jul-1981   DATE OF ADMISSION:  01/22/2006  DATE OF DISCHARGE:  01/25/2006                                 DISCHARGE SUMMARY   FINAL DIAGNOSES:  1.  Gastroenteritis.  2.  Bronchitis.  3.  Early pneumonia.   PROCEDURES:  1.  CT scan of the abdomen and pelvis showed no acute intra-abdominal or      intrapelvic pathology.  There was an incidental finding of left lower      lobe pneumonia on chest CT.  2.  Chest x-ray showed significant bronchitic changes.   HISTORY OF PRESENT ILLNESS:  Ms. Ende is a 30 year old African American  female with no known significant past medical history.  She presented to the  emergency room on the day of admission with vomiting, diarrhea, and  abdominal pain.  She had been seen in the emergency room a couple of days  prior to this admission.  At that time, she was evaluated with a CT scan of  the abdomen, and CT was negative for any acute intra-abdominal pathology.  Given the persistence of the nausea and vomiting associated with diarrhea,  the patient was admitted for treatment with IV fluids and further  evaluation.   HOSPITAL COURSE:  Ms. Bermingham was started on IV fluids.  Stool studies were  ordered.  C dif was negative.  There was no fecal leukocytes.  Could not  produce stool for.  Nausea and vomiting resolved gradually.  Pain also  abated with analgesia.  She was able to keep food down.   During the course of hospitalization, she developed fever.  A chest x-ray  was ordered.  This showed bronchitic changes.  At the same time, the patient  had complaint of persistent abdominal pain.  She was again evaluated with a  CT scan which showed no acute intra-abdominal pathology; however, there was  left lower lobe  pneumonia.  Hence, this was felt to have probably caused the  abdominal pain which is presumed to be referred pain.  Of note is that the  abdominal pain is significantly on the right flank.   She was started on IV Avelox.  The fever defervesced.  The patient felt  better and was deemed suitable to be discharged home.  According to her  history, she was treated for bronchitis with doxycycline prior to  hospitalization.  She was discharged home on Avelox to complete one week of  treatment.   DISCHARGE MEDICATIONS:  1.  Avelox 200 mg daily.  2.  Mucinex 600 mg p.o. b.i.d.   FOLLOW UP:  The patient is to follow up with her PCP in one to two weeks.      Mobolaji B. Corky Downs, M.D.  Electronically Signed     MBB/MEDQ  D:  02/27/2006  T:  02/27/2006  Job:  [id number]

## 2011-05-17 NOTE — Discharge Summary (Signed)
Marie George, Marie George              ACCOUNT NO.:  [id number]   MEDICAL RECORD NO.:  [id number]          PATIENT TYPE:  INP   LOCATION:  6706                         FACILITY:  [facility]   PHYSICIAN:  Isidor Holts, M.D.  DATE OF BIRTH:  [birth year]   DATE OF ADMISSION:  01/30/2006  DATE OF DISCHARGE:                                 DISCHARGE SUMMARY   DISCHARGE DIAGNOSES:  1.  Persistent vomiting.  2.  Gastritis confirmed by upper gastrointestinal endoscopy January 31, 2006, by Dr. Elnoria Howard.  3.  Severe depression.   DISCHARGE MEDICATIONS:  1.  Protonix 40 mg p.o. daily for 2 weeks (may utilize Prilosec OTC 20 mg      p.o. daily instead).  2.  Lexapro 5 mg p.o. daily.  3.  Zyprexa 5 mg p.o. q.h.s.  4.  Ambien 5 mg p.o. p.r.n. q.h.s. for insomnia.   PROCEDURES:  1.  Abdominopelvic CT scan dated January 28, 2006. This showed decrease in      left lower lobe infiltrate or atelectasis, no intraabdominal pathology      identified. There was no acute pelvic abnormality.  2.  Abdominal x-ray dated January 30, 2006. This showed nasogastric tube      ending in the stomach.  3.  Chest x-ray dated January 30, 2006. This showed nasogastric tube tip      looped in the fundus of the stomach.  4.  Head CT scan dated January 30, 2006. This was a negative head CT.   CONSULTATIONS:  1.  Dr. Jeani Hawking, gastroenterologist.  2.  Dr. Antonietta Breach, psychiatrist.   ADMISSION HISTORY:  As in H&P notes of January 30, 2006, dictated by Dr.  Michaelyn Barter. However, in brief, this is a 30 year old female, who has  had recurrent admissions, approximately three in the past 1 week, for  persistent vomiting and questionable abdominal pain. She has recently  recovered from a left lower lobe pneumonia. She presents with yet another  episode of persistent vomiting and was admitted for further evaluation,  investigation, and management.   CLINICAL COURSE:  #1 - PERSISTENT VOMITING. The patient was  managed with  bowel rest and initial NG tube placement, with intermittent low-pressure  suction, antiemetics, intravenous fluids, and proton pump inhibitor  treatment. Gastroenterology consultation was called which was kindly  provided by Dr. Jeani Hawking who recommended a head CT scan. This was done  on January 30, 2006, and was negative. The patient also had an upper GI  endoscopy on January 31, 2006. This showed gastritis. H. pylori test was  sent off, however, results were not available at the time of this dictation.  The patient responded to above-mentioned treatment measures, clinical  symptoms gradually ameliorated, and by February 02, 2006, was completely  asymptomatic. She remained asymptomatic until ward round of February 03, 2006. The patient was noted to be hypokalemic during the course of her  hospital stay secondary to vomiting. This was repleted as appropriate.   #2 - SEVERE DEPRESSION. The patient was noted to be quite severely  depressed, at the time  of admission. Psychiatric consultation was called and  initially the impression was that the patient was somewhat suicidal. She  claimed to have plans but would not divulge these plans. Therefore, it was  felt that she would benefit from a period of inpatient management at the  [facility]. However, due to persistent background medical  problems, i.e. vomiting, it was subsequently felt that patient did not meet  the appropriate criteria for admission to [facility]. Admission was therefore deferred. As the patient was felt to be  suicidal, she was maintained on one-on-one nursing and committed  involuntarily for 48 hours through the weekend from February 01, 2006, to  allow appropriate treatment to be administered. Per recommendation of  psychiatry, she was managed with a combination of Lexapro, Zyprexa, and  p.r.n. Ambien. The patient was reevaluated by psychiatry on February 03, 2006,  at which time she was deemed not in danger of self harm, had no  suicidal intentions or plans, and was considered appropriate for management  on an outpatient basis. Arrangements have been put in place for her to be  followed up at the psychiatry outpatient clinic.   DISPOSITION:  The patient was discharged in satisfactory condition on  February 03, 2006. She is recommended to return to work on February 10, 2006.   DIET:  No restrictions.   ACTIVITY:  As tolerated.   WOUND CARE:  Not applicable.   PAIN MANAGEMENT:  Not applicable.   FOLLOW-UP INSTRUCTIONS:  The patient has been recommended to establish a  PMD, and has been offered a referral to [facility], which she has declined.  She elects to find and establish her own PMD. Follow-up arrangements have  also been arranged at the psychiatric outpatient clinic.      Isidor Holts, M.D.  Electronically Signed     CO/MEDQ  D:  02/03/2006  T:  02/03/2006  Job:  [id number]

## 2011-05-17 NOTE — H&P (Signed)
Marie George, Marie George              ACCOUNT NO.:  [id number]   MEDICAL RECORD NO.:  [id number]          PATIENT TYPE:  INP   LOCATION:  0103                         FACILITY:  [facility]   PHYSICIAN:  Isidor Holts, M.D.  DATE OF BIRTH:  June 04, 1981   DATE OF ADMISSION:  01/27/2006  DATE OF DISCHARGE:                                HISTORY & PHYSICAL   PRIMARY CARE PHYSICIAN:  Unassigned.   CHIEF COMPLAINT:  Abdominal pain, chest pain, vomiting for the past 2 days.   HISTORY OF PRESENT ILLNESS:  This is a 30 year old female, status post  admission to [facility] January 22, 2006, to January 25, 2006, for  gastroenteritis and left lower lobe pneumonia.  She was discharged on  pneumonia medications.  According to patient, she had stopped vomiting by  the time of discharge.  Later the next day; i.e., January 26, 2006, she  started vomiting, over 10 per day, denied eating anything unusual.  Since  then, has had one watery stool on January 26, 2006, but none since.  She  went to the emergency department at [facility] on January 26, 2006,  was given intravenous fluids, and discharged on antibiotics which she had  been unable to keep down, secondary to vomiting.  Denies fever, but has had  a cough productive of greenish phlegm.   PAST MEDICAL HISTORY:  Admission to [facility] January 22, 2006, to  January 25, 2006, for gastroenteritis and left lower lobe pneumonia.  Nothing else of note.   MEDICATIONS:  Recent treatment with the following medications.  1.  Vicodin.  2.  Phenergan.  3.  Doxycycline 100 mg p.o. twice daily.  4.  Avelox 400 mg p.o. daily.   ALLERGIES:  No known drug allergies.   REVIEW OF SYSTEMS:  As in HPI and Chief Complaint, otherwise negative.   SOCIAL HISTORY:  The patient is single, works as a Web designer.  Has  no offspring.  Nonsmoker, nondrinker.  Has no history of drug abuse.   FAMILY HISTORY:  Noncontributory.   PHYSICAL  EXAMINATION:  VITAL SIGNS: Temperature 101.2, pulse 64 per minute,  respiratory rate 18, blood pressure 143/91 mmHg, pulse oximetry 100% on room  air.  GENERAL:  The patient is alert and oriented. Not short of breath at rest.  Vomiting profuse bilious vomitus.  HEENT:  No clinical pallor, no jaundice, no conjunctival injection.  NECK:  Supple.  JVP not seen.  No palpable lymphadenopathy, no palpable  goiter, no carotid bruits.  CHEST: Clinically clear to auscultation.  No wheezing and no crackles,  although there is diminished air entry left base.  HEART:  Sound 1 and 2 heard, normal, regular.  No murmurs.  ABDOMEN:  There is no palpable organomegaly, no palpable mass.  Abdomen is  full, soft.  There is moderate tenderness in the left lower quadrant.  Bowel  sounds, however, are normal.  EXTREMITIES:  Lower extremity examination is unremarkable.  MUSCULOSKELETAL: No abnormalities seen.  CENTRAL NERVOUS SYSTEM:  No focal neurologic deficits on gross examination.   INVESTIGATIONS:  No labs are present.  Chest x-ray dated January 23, 2006, shows streaky bibasilar atelectasis but  otherwise negative.   ASSESSMENT AND PLAN:  1.  Intractable vomiting.  We shall keep patient n.p.o., utilize NG tube      with low-pressure intermittent suction.  Administer intravenous fluids      to maintain hydration.  Follow electrolytes, and correct as appropriate.      Utilize proton pump inhibitor and antiemetics. We will check amylase and      lipase levels to rule out acute pancreatitis and do a urine drug screen      for completeness.  Stool sent off for C. difficile toxin, given history      of recent multiple antibiotic treatments.   1.  Febrile illness. The patient had been diagnosed with pneumonia fairly      recently, per CT scan on January 24, 2006, which showed no acute intra-      abdominal pathology but confirmed a left lower lobe pneumonia.  The      patient did not complete her course of  antibiotics secondary to #1      above.  Chest x-ray on January 27, 2006, is unimpressive, however, we      will complete treatment with Primaxin, send off blood cultures and do      urinalysis.   1.  Left lower quadrant abdominal pain. The patient did indeed have an      abdominal CT scan January 24, 2006, which reported no acute intra-      abdominal pathology.  However, she does appear to have appreciable left      lower quadrant tenderness, thus a consideration is possible pelvic      inflammatory disease. This should be adequately covered by Primaxin.  We      shall, however, add Flagyl and arrange abdominal CT scan, possibly on      January 28, 2006.  If the patient turns out to have C. difficile      colitis, Flagyl would happen to be a good choice.   Further management will depend on clinical course.      Isidor Holts, M.D.  Electronically Signed     CO/MEDQ  D:  01/27/2006  T:  01/27/2006  Job:  [id number]

## 2011-05-17 NOTE — Discharge Summary (Signed)
Marie George, Marie George              ACCOUNT NO.:  [id number]   MEDICAL RECORD NO.:  [id number]          PATIENT TYPE:  INP   LOCATION:  5501                         FACILITY:  [facility]   PHYSICIAN:  Elliot Cousin, M.D.    DATE OF BIRTH:  February 17, 1981   DATE OF ADMISSION:  01/27/2006  DATE OF DISCHARGE:  01/29/2006                                 DISCHARGE SUMMARY   DISCHARGE DIAGNOSES:  1.  Left lower quadrant abdominal tenderness.  2.  Recurrent nausea and vomiting with one episode of diarrhea.  3.  Left lower lobe pneumonia.  4.  Hypokalemia.   SECONDARY DISCHARGE DIAGNOSES:  1.  Hospitalized at [facility] from January24,2007 to      January27,2007 for gastroenteritis and left lower lobe pneumonia.  2.  History of marijuana use.   DISCHARGE MEDICATIONS:  1.  Potassium chloride 20 mEq, 1 tablet daily for one week.  2.  Phenergan 25 mg every 4-6 hours as needed for nausea.  3.  Multivitamin with iron once daily.   DISCHARGE DISPOSITION:  The patient was discharged to home in improved and  stable condition on January 29, 2006. She was advised to follow up with the  [facility] clinic in 1-2 weeks.   PROCEDURE PERFORMED:  1.  CT scan of the abdomen and pelvis. January13,2007. The results revealed      decrease in left lower lobe infiltrate versus atelectasis, no      intraabdominal pathology identified.  2.  CT scan of the pelvis revealed no acute pelvic abnormality.   HISTORY OF PRESENT ILLNESS:  The patient is a 30 year old lady with a past  medical history significant for a recent hospitalization from January24,2007  to January27,2007 for gastroenteritis and left lower lobe pneumonia, who  presented again to the emergency department on January29,2007 with a chief  complaint of left lower quadrant abdominal pain, nausea, vomiting and one  episode of diarrhea. When the patient was discharged to home on  January27,2007, she was prescribed antibiotics and antiemetics. Later  on in  the day after taking a number of her medications, she began vomiting. She  presented to the emergency department at [facility] on January28,2007  and was given IV fluids and discharged home on additional antibiotics.  However, the patient was unable to keep any medications down. The patient  was therefore admitted again for further evaluation and management.   For additional details, please see the dictated history and physical.   HOSPITAL COURSE:  Problem 1. Left lower quadrant abdominal  tenderness/recurrent nausea and vomiting/hypokalemia. The patient was  started on maintenance IV fluids with normal saline with potassium chloride  added at 125 cc an hour. In addition, she was started on Phenergan as needed  and Reglan 10 mg IV q.6h. Her IV fluids were changed to add dextrose and 1  amp of multivitamin with iron daily. She was made n.p.o. An NG tube was  ordered on admission, however, the patient refused it. A number of  investigational studies were ordered including an urinalysis,  stool study  to rule out C. Diff toxin, amylase and  lipase levels, and a CT scan of the  abdomen and pelvis. Blood cultures were also ordered from the emergency  department. The patient's urinalysis was negative, the amylase was within  normal limits at 47, and her lipase was slightly low at 16. A RPR serology  was also ordered and was nonreactive. The patient's blood cultures remained  negative during the hospital course. The CT scan of the abdomen and pelvis  revealed no intra-abdominal pathology. There was also no acute pelvic  abnormalities. The CT scan did show a decrease in the left lower lobe  infiltrate versus atelectasis. The patient was febrile x1 in the emergency  department, however, she became afebrile and remained afebrile throughout  the entire hospital course. She was started on empiric antibiotic treatment  with Primaxin and Flagyl.   Over the course of the hospitalization,  the patient's nausea, vomiting and  abdominal tenderness completely resolved. She had no diarrhea during the  hospital course and therefore stool specimens were not obtained.  Following  improvement, the patient was started on a full liquid diet. She tolerated  the full liquid diet well. The patient's serum potassium was low, ranging  between 3.2 and 3.5 during the hospital course. The hypokalemia was felt to  be secondary to nausea and vomiting as well as one episode of diarrhea. The  patient was repleted with potassium chloride in IV fluids. She was also  given potassium chloride prior to hospital discharge. Her serum potassium at  the time of hospital discharge was 3.2. The patient was advised to continue  supplementation outpatient for one additional week.   The patient felt that the nausea and vomiting were prompted by taking the  antibiotics that were prescribed following hospital discharge from [facility] and from the emergency department at [facility]. At the  time of hospital discharge, the patient had no complaints with nausea,  vomiting, diarrhea or abdominal pain. She was advised to eat a somewhat  bland diet over the next few days.   Problem 2. Pneumonia. The patient was treated with antibiotics during the  previous hospitalization. It appears that she completed at least a 3-4 day  course of antibiotic therapy (records currently not available ).  During  this hospitalization, the patient was started on Primaxin and Flagyl. She  received two full days of these antibiotics. The CT scan of the abdomen did  reveal that there was significant improvement in the left lower lobe  infiltrate. The patient was therefore discharged to home on no antibiotics.  The patient noted that when she started taking the antibiotics previously  prescribed, they apparently caused  nausea, vomiting and abdominal pain. It is for this reason as well as clinical and radiographic  improvement that an  antibiotic was not prescribed at the time of hospital discharge. The patient  had no complaints of chest pain, shortness of breath or cough. She was  completely afebrile at the time of hospital discharge.      Elliot Cousin, M.D.  Electronically Signed     DF/MEDQ  D:  01/29/2006  T:  01/29/2006  Job:  [id number]

## 2011-06-18 ENCOUNTER — Emergency Department (HOSPITAL_COMMUNITY)
Admission: EM | Admit: 2011-06-18 | Discharge: 2011-06-18 | Disposition: A | Discharge status: Home / Self Care | Payer: Self-pay | Attending: Emergency Medicine | Admitting: Emergency Medicine

## 2011-06-18 ENCOUNTER — Emergency Department (HOSPITAL_COMMUNITY): Payer: Self-pay

## 2011-06-18 DIAGNOSIS — A499 Bacterial infection, unspecified: Secondary | ICD-10-CM | POA: Insufficient documentation

## 2011-06-18 DIAGNOSIS — Z975 Presence of (intrauterine) contraceptive device: Secondary | ICD-10-CM | POA: Insufficient documentation

## 2011-06-18 DIAGNOSIS — R1032 Left lower quadrant pain: Secondary | ICD-10-CM | POA: Insufficient documentation

## 2011-06-18 DIAGNOSIS — B9689 Other specified bacterial agents as the cause of diseases classified elsewhere: Secondary | ICD-10-CM | POA: Insufficient documentation

## 2011-06-18 DIAGNOSIS — N760 Acute vaginitis: Secondary | ICD-10-CM | POA: Insufficient documentation

## 2011-06-18 LAB — CBC
HCT: 36.6 % (ref 36.0–46.0)
Hemoglobin: 12.6 g/dL (ref 12.0–15.0)
RBC: 4.22 MIL/uL (ref 3.87–5.11)
WBC: 5.2 10*3/uL (ref 4.0–10.5)

## 2011-06-18 LAB — BASIC METABOLIC PANEL
BUN: 11 mg/dL (ref 6–23)
Chloride: 103 mEq/L (ref 96–112)
GFR calc Af Amer: >60 mL/min (ref >60)
Glucose, Bld: 89 mg/dL (ref 70–99)
Potassium: 3.6 mEq/L (ref 3.5–5.1)

## 2011-06-18 LAB — DIFFERENTIAL
Basophils Absolute: 0 10*3/uL (ref 0.0–0.1)
Lymphocytes Relative: 50 % — ABNORMAL HIGH (ref 12–46)
Neutro Abs: 2 10*3/uL (ref 1.7–7.7)

## 2011-06-18 LAB — URINALYSIS, ROUTINE W REFLEX MICROSCOPIC
Bilirubin Urine: NEGATIVE
Hgb urine dipstick: NEGATIVE
Nitrite: NEGATIVE
Specific Gravity, Urine: 1.022 (ref 1.005–1.030)
pH: 6 (ref 5.0–8.0)

## 2011-06-18 LAB — WET PREP, GENITAL: Trich, Wet Prep: NONE SEEN

## 2011-06-19 LAB — GC/CHLAMYDIA PROBE AMP, GENITAL: Chlamydia, DNA Probe: NEGATIVE

## 2011-09-19 LAB — URINALYSIS, ROUTINE W REFLEX MICROSCOPIC
Glucose, UA: NEGATIVE
Hgb urine dipstick: NEGATIVE
Specific Gravity, Urine: 1.025

## 2011-09-19 LAB — COMPREHENSIVE METABOLIC PANEL
Albumin: 4.6
BUN: 14
Calcium: 10.1
Creatinine, Ser: 0.92
Potassium: 4.1
Total Protein: 7.9

## 2011-09-19 LAB — POCT PREGNANCY, URINE
Operator id: 231701
Preg Test, Ur: NEGATIVE

## 2011-09-19 LAB — DIFFERENTIAL
Lymphocytes Relative: 32
Lymphs Abs: 1.8
Monocytes Absolute: 0.5
Monocytes Relative: 9
Neutro Abs: 3.2

## 2011-09-19 LAB — CBC
HCT: 38.1
MCHC: 33.9
Platelets: 243
RDW: 12.9

## 2011-09-24 LAB — URINALYSIS, ROUTINE W REFLEX MICROSCOPIC
Bilirubin Urine: NEGATIVE
Nitrite: NEGATIVE
Protein, ur: NEGATIVE
Specific Gravity, Urine: 1.018
Urobilinogen, UA: 0.2

## 2011-09-24 LAB — COMPREHENSIVE METABOLIC PANEL
Albumin: 4.3
Alkaline Phosphatase: 51
BUN: 9
Calcium: 9.7
Glucose, Bld: 79
Potassium: 4
Total Protein: 7.5

## 2011-09-24 LAB — CBC
HCT: 38.1
Hemoglobin: 12.7
MCHC: 33.4
Platelets: 230
RDW: 13

## 2011-09-24 LAB — POCT PREGNANCY, URINE
Operator id: 30368
Preg Test, Ur: NEGATIVE

## 2011-09-24 LAB — DIFFERENTIAL
Basophils Relative: 1
Eosinophils Absolute: 0
Eosinophils Relative: 0
Lymphocytes Relative: 35
Monocytes Relative: 15 — ABNORMAL HIGH
Neutro Abs: 1.9
Neutrophils Relative %: 49

## 2011-09-26 LAB — URINALYSIS, ROUTINE W REFLEX MICROSCOPIC
Glucose, UA: NEGATIVE
Glucose, UA: NEGATIVE
Ketones, ur: 40 — AB
Ketones, ur: >80 — AB
Leukocytes, UA: NEGATIVE
Leukocytes, UA: NEGATIVE
Nitrite: NEGATIVE
Protein, ur: 30 — AB
Specific Gravity, Urine: 1.026
Urobilinogen, UA: 0.2
pH: 8

## 2011-09-26 LAB — DIFFERENTIAL
Basophils Absolute: 0
Basophils Relative: 0
Eosinophils Absolute: 0
Eosinophils Absolute: 0
Eosinophils Relative: 0
Lymphs Abs: 2
Monocytes Absolute: 1
Monocytes Relative: 14 — ABNORMAL HIGH
Monocytes Relative: 9
Neutro Abs: 8.9 — ABNORMAL HIGH
Neutrophils Relative %: 83 — ABNORMAL HIGH

## 2011-09-26 LAB — COMPREHENSIVE METABOLIC PANEL
ALT: 21
ALT: 24
ALT: 51 — ABNORMAL HIGH
AST: 43 — ABNORMAL HIGH
Albumin: 5.2
Alkaline Phosphatase: 58
BUN: 17
CO2: 19
CO2: 21
Calcium: 10.2
Calcium: 10.5
Chloride: 105
Creatinine, Ser: 0.88
GFR calc Af Amer: >60
GFR calc non Af Amer: >60
Glucose, Bld: 103 — ABNORMAL HIGH
Glucose, Bld: 131 — ABNORMAL HIGH
Potassium: 2.9 — ABNORMAL LOW
Potassium: 3.6
Sodium: 139
Sodium: 140
Sodium: 142
Total Bilirubin: 1
Total Protein: 7.9
Total Protein: 8.2
Total Protein: 9.1 — ABNORMAL HIGH

## 2011-09-26 LAB — CBC
HCT: 40.1
Hemoglobin: 13.3
Hemoglobin: 13.3
MCHC: 32.5
MCHC: 32.6
MCV: 90.9
RBC: 4.45
RDW: 13.2
RDW: 13.3
RDW: 13.4
WBC: 10.7 — ABNORMAL HIGH

## 2011-09-26 LAB — POCT PREGNANCY, URINE: Preg Test, Ur: NEGATIVE

## 2011-09-26 LAB — LIPASE, BLOOD: Lipase: 10 — ABNORMAL LOW

## 2011-09-26 LAB — RPR: RPR Ser Ql: NONREACTIVE

## 2011-09-26 LAB — GC/CHLAMYDIA PROBE AMP, GENITAL
Chlamydia, DNA Probe: POSITIVE — AB
GC Probe Amp, Genital: NEGATIVE

## 2011-09-26 LAB — URINE MICROSCOPIC-ADD ON

## 2011-09-26 LAB — PREGNANCY, URINE: Preg Test, Ur: NEGATIVE

## 2011-09-30 LAB — CBC
HCT: 41.9
MCHC: 32.4
MCV: 91.7
Platelets: 248
RDW: 13
WBC: 4.4

## 2011-09-30 LAB — COMPREHENSIVE METABOLIC PANEL
Albumin: 4.6
BUN: 10
Chloride: 109
Creatinine, Ser: 1.01
GFR calc non Af Amer: >60
Total Bilirubin: 0.6

## 2011-09-30 LAB — DIFFERENTIAL
Basophils Absolute: 0
Lymphocytes Relative: 39
Monocytes Absolute: 0.4
Neutro Abs: 2.2

## 2011-09-30 LAB — LIPASE, BLOOD: Lipase: 26

## 2014-02-07 ENCOUNTER — Encounter (HOSPITAL_COMMUNITY): Payer: Self-pay | Admitting: Emergency Medicine

## 2014-02-07 ENCOUNTER — Emergency Department (HOSPITAL_COMMUNITY)
Admission: EM | Admit: 2014-02-07 | Discharge: 2014-02-07 | Disposition: A | Discharge status: Home / Self Care | Payer: Self-pay | Attending: Emergency Medicine | Admitting: Emergency Medicine

## 2014-02-07 DIAGNOSIS — R05 Cough: Secondary | ICD-10-CM

## 2014-02-07 DIAGNOSIS — J329 Chronic sinusitis, unspecified: Secondary | ICD-10-CM | POA: Insufficient documentation

## 2014-02-07 DIAGNOSIS — R059 Cough, unspecified: Secondary | ICD-10-CM

## 2014-02-07 LAB — RAPID STREP SCREEN (MED CTR MEBANE ONLY): STREPTOCOCCUS, GROUP A SCREEN (DIRECT): NEGATIVE

## 2014-02-07 MED ORDER — FLUTICASONE PROPIONATE 50 MCG/ACT NA SUSP: 2.0000 | Freq: Every day | NASAL | Status: AC

## 2014-02-07 MED ORDER — KETOROLAC TROMETHAMINE 60 MG/2ML IM SOLN
INTRAMUSCULAR | Status: AC
  Filled 2014-02-07: qty 2

## 2014-02-07 MED ORDER — KETOROLAC TROMETHAMINE 30 MG/ML IJ SOLN
30.0000 mg | Freq: Once | INTRAMUSCULAR | Status: AC
  Administered 2014-02-07: 30 mg via INTRAVENOUS
  Filled 2014-02-07: qty 1

## 2014-02-07 MED ORDER — BENZONATATE 100 MG PO CAPS: 200.0000 mg | ORAL_CAPSULE | Freq: Two times a day (BID) | ORAL | Status: AC | PRN

## 2014-02-07 MED ORDER — OXYMETAZOLINE HCL 0.05 % NA SOLN: 1.0000 | Freq: Two times a day (BID) | NASAL | Status: AC

## 2014-02-07 NOTE — ED Notes (Signed)
Pt markedly more relaxed after injection was given. PA at bedside

## 2014-02-07 NOTE — Progress Notes (Signed)
[facility] provided pt with a list of primary care resources, [facility] information, and a [facility] [employer] application. Patient then stated that she lived in [place], [place]. Spoke with [facility] ED CM Amy about providing patient with additional resources for [employer]

## 2014-02-07 NOTE — ED Provider Notes (Signed)
CSN: [id number]     Arrival date & time 02/07/14  1410 History  This chart was scribed for non-physician practitioner Mellody DrownLauren Deanda Ruddell, PA-C working with Junius ArgyleForrest S Harrison, MD by Dorothey Basemania Sutton, ED Scribe. This patient was seen in room WTR7/WTR7 and the patient's care was started at 5:12 PM.    Chief Complaint  Patient presents with  . Sore Throat  . Generalized Body Aches   The history is provided by the patient. No language interpreter was used.   HPI Comments: Lamar BlinksDenicia B Suk is a 33 y.o. female who presents to the Emergency Department complaining of a constant sore throat with associated mild, dry cough, rhinorrhea, and diffuse myalgias onset a few days ago that has been progressively worsening. Patient states that the myalgias are most localized around the bilateral thighs and into the lower back. She reports taking a liquid cough suppressant (patient does not remember which) and Norco at home last night with moderate, temporary relief of her symptoms. She denies nausea, emesis. She denies any allergies to medications. Patient has no other pertinent medical history.   History reviewed. No pertinent past medical history. History reviewed. No pertinent past surgical history. History reviewed. No pertinent family history. History  Substance Use Topics  . Smoking status: Never Smoker   . Smokeless tobacco: Not on file  . Alcohol Use: Yes   OB History   Grav Para Term Preterm Abortions TAB SAB Ect Mult Living                 Review of Systems  HENT: Positive for rhinorrhea and sore throat. Negative for trouble swallowing and voice change.   Respiratory: Positive for cough. Negative for wheezing.   Gastrointestinal: Negative for nausea and vomiting.  Musculoskeletal: Positive for myalgias.   Allergies  Review of patient's allergies indicates no known allergies.  Home Medications   Current Outpatient Rx  Name  Route  Sig  Dispense  Refill  . HYDROcodone-acetaminophen (NORCO/VICODIN)  5-325 MG per tablet   Oral   Take 1 tablet by mouth every 4 (four) hours as needed for moderate pain.          Triage Vitals: BP 153/95  Pulse 96  Temp(Src) 99 F (37.2 C) (Oral)  Resp 19  SpO2 99%  Physical Exam  Nursing note and vitals reviewed. Constitutional: She is oriented to person, place, and time. She appears well-developed and well-nourished. No distress.  HENT:  Head: Normocephalic and atraumatic.  Right Ear: Hearing, tympanic membrane, external ear and ear canal normal.  Left Ear: Hearing, tympanic membrane, external ear and ear canal normal.  Nose: Mucosal edema and rhinorrhea present. Right sinus exhibits maxillary sinus tenderness and frontal sinus tenderness. Left sinus exhibits frontal sinus tenderness.  Mouth/Throat: Uvula is midline and mucous membranes are normal. No trismus in the jaw. Posterior oropharyngeal erythema present. No oropharyngeal exudate or tonsillar abscesses.  Mild pharyngeal erythema. No tonsillar swelling or exudate.   Minimal tenderness to palpation to the frontal and maxillary sinuses.   Eyes: Conjunctivae and EOM are normal. Right eye exhibits no discharge. Left eye exhibits no discharge.  Neck: Normal range of motion. Neck supple.  Cardiovascular: Normal rate, regular rhythm and normal heart sounds.   Pulmonary/Chest: Effort normal and breath sounds normal. No respiratory distress. She has no wheezes.  Abdominal: She exhibits no distension.  Musculoskeletal: Normal range of motion.  Lymphadenopathy:       Head (right side): No submental, no submandibular, no tonsillar, no preauricular, no  posterior auricular and no occipital adenopathy present.       Head (left side): No submental, no submandibular, no tonsillar, no preauricular, no posterior auricular and no occipital adenopathy present.    She has no cervical adenopathy.  Neurological: She is alert and oriented to person, place, and time.  Skin: Skin is warm and dry.  Psychiatric: She  has a normal mood and affect. Her behavior is normal.    ED Course  Procedures (including critical care time)  COORDINATION OF CARE: 5:16 PM- Ordered an injection of Toradol. Discussed that rapid strep screen was negative and that symptoms are likely due to a viral sinus infection. Will discharge patient with Afrin, Flonase, and Tessalon pearls to manage symptoms. Advised patient to use warm salt water gargles at home to help with the sore throat. Discussed treatment plan with patient at bedside and patient verbalized agreement.     Labs Review Labs Reviewed  RAPID STREP SCREEN   Imaging Review No results found.  EKG Interpretation   None       MDM   Final diagnoses:  Sinus infection  Cough   Pt with sinus pressure and generalized body aches.  Likely viral respiratory infection, will treat symptomatically. Discussed treatment plan with the patient. Return precautions given. Reports understanding and no other concerns at this time.  Patient is stable for discharge at this time.   Meds given in ED:  Medications  ketorolac (TORADOL) 30 MG/ML injection 30 mg (30 mg Intravenous Given 02/07/14 1710)    Discharge Medication List as of 02/07/2014  5:26 PM    START taking these medications   Details  benzonatate (TESSALON) 100 MG capsule Take 2 capsules (200 mg total) by mouth 2 (two) times daily as needed for cough., Starting 02/07/2014, Until Discontinued, Print    fluticasone (FLONASE) 50 MCG/ACT nasal spray Place 2 sprays into both nostrils daily., Starting 02/07/2014, Last dose on Mon 02/07/14, Print    oxymetazoline (AFRIN NASAL SPRAY) 0.05 % nasal spray Place 1 spray into both nostrils 2 (two) times daily., Starting 02/07/2014, Until Discontinued, Print        I personally performed the services described in this documentation, which was scribed in my presence. The recorded information has been reviewed and is accurate.      Clabe Seal, PA-C 02/10/14 [phone]

## 2014-02-07 NOTE — ED Notes (Signed)
Pt states she has had sore throat with generalized body aches and cough for past few days. Pt states pain is worst in thighs and goes up back.

## 2014-02-07 NOTE — ED Notes (Signed)
Pt very weepy and crying loudly in room. Pt reassured that PA would see her as soon as possible

## 2014-02-07 NOTE — Progress Notes (Signed)
   CARE MANAGEMENT ED NOTE 02/07/2014  Patient:  Lamar BlinksBETHEA,Meyer B   Account Number:  [id number]  Date Initiated:  02/07/2014  Documentation initiated by:  Radford PaxFERRERO,Iver Fehrenbach  Subjective/Objective Assessment:   Patient presents to Ed with constant sore throat with associated dry cough and diffuse myalgias onset several days ago     Subjective/Objective Assessment Detail:   No pertinent pmhx. Patient with low grade temp 99.0     Action/Plan:   Action/Plan Detail:   Anticipated DC Date:       Status Recommendation to Physician:   Result of Recommendation:    Other ED Services  Consult Working Plan    DC Planning Services  Other  PCP issues    Choice offered to / List presented to:            Status of service:  Completed, signed off  ED Comments:   ED Comments Detail:  EDCM spoke to patient at bedside.  [place] [facility] [place].  Patient confirms she does not have a pcp.  EDCM provided patient with a list of all the low cost or free clinics in [place].  Patient thankful for resources.  No further EDCM needs at this time.

## 2014-02-07 NOTE — Discharge Instructions (Signed)
Call for a follow up appointment with a Family or Primary Care Provider.  °Return if Symptoms worsen.   °Take medication as prescribed.  ° ° °Emergency Department Resource Guide °1) Find a Doctor and Pay Out of Pocket °Although you won't have to find out who is covered by your insurance plan, it is a good idea to ask around and get recommendations. You will then need to call the office and see if the doctor you have chosen will accept you as a new patient and what types of options they offer for patients who are self-pay. Some doctors offer discounts or will set up payment plans for their patients who do not have insurance, but you will need to ask so you aren't surprised when you get to your appointment. ° °2) Contact Your Local Health Department °Not all health departments have doctors that can see patients for sick visits, but many do, so it is worth a call to see if yours does. If you don't know where your local health department is, you can check in your phone book. The CDC also has a tool to help you locate your state's health department, and many state websites also have listings of all of their local health departments. ° °3) Find a Walk-in Clinic °If your illness is not likely to be very severe or complicated, you may want to try a walk in clinic. These are popping up all over the country in pharmacies, drugstores, and shopping centers. They're usually staffed by nurse practitioners or physician assistants that have been trained to treat common illnesses and complaints. They're usually fairly quick and inexpensive. However, if you have serious medical issues or chronic medical problems, these are probably not your best option. ° °No Primary Care Doctor: °- Call Health Connect at  832-8000 - they can help you locate a primary care doctor that  accepts your insurance, provides certain services, etc. °- Physician Referral Service- 1-800-533-3463 ° °Chronic Pain Problems: °Organization         Address  Phone    Notes  °[facility]  (336) 297-2271 Patients need to be referred by their primary care doctor.  ° °Medication Assistance: °Organization         Address  Phone   Notes  °[place] [facility] [address] °[place], [place] [address] (336) 641-8030 --Must be a resident of [place] °-- Must have NO insurance coverage whatsoever (no Medicaid/ Medicare, etc.) °-- The pt. MUST have a primary care doctor that directs their care regularly and follows them in the community °  °[facility]  (866) 331-1348   °[employer]  (888) 892-1162   ° °Agencies that provide inexpensive medical care: °Organization         Address  Phone   Notes  °[facility]  (336) 832-8035   °[facility]    (336) 832-7272   °[facility] [address] °[place], [place] [address] (336) 832-4777   °[facility] [address], °[place] (336) 271-4999   °[facility]    (336) 373-0678   °[facility]    (336) 272-1050   °[facility] ° [address], [place] Phone:  (336) 832-4444, Fax:  (336) 832-4440 Hours of Operation:  9 am - 6 pm, M-F.  Also accepts Medicaid/Medicare and self-pay.  °[facility] ° [address], [place]   Phone: (336) 832-3150, Fax: (336) 832-3151. Hours of Operation:  8:30 am - 5:30 pm, M-F.  Also accepts Medicaid and self-pay.  °[facility] [place] [address], [place] Phone: (336) 878-6027   °[facility] [address], [place], [place] (336)723-1848, Ext. 123 Mondays & Thursdays: 7-9 AM.  First 15 patients are seen on a first come, first serve basis. °  ° °Medicaid-accepting [place] Providers: ° °Organization         Address  Phone   Notes  °[facility] [address], [place] (336) 641-2100 Also accepts self-pay patients.  °[facility]  [address], [place] ° (336) 856-9996   °[facility] [address], [place] (336) 288-8857   °[facility] [address] [place] [address], [place] (336) 299-7000   °Veita Bland [address], [place]  ° (336) 373-1557 Only accepts [place] Access Medicaid patients after they have their name applied to their card.  ° °Self-Pay (no insurance) in [place]: ° °Organization         Address  Phone   Notes  °Sickle Cell Patients, [facility] [address], [place] (336) 832-1970   °[facility] [address], [place] (336) 832-4400   ° [facility] ° [address], [place] (336) 992-4800   °[facility]/Dr. Osei-Bonsu ° [address] [place] [address], [place] or [address], [place] (336) 841-8500 Phone number for both [place] and [place] locations is the same.  °[facility] [address], [place] (336) 299-0000   °[facility] [address] [place] [address], [place] or [address] (336) 852-7530 °(336) 878-2260   °[facility] [address], [place] (336) 350-1642, phone; (336) 294-5005, fax Sees patients 1st and 3rd Saturday of every month.  Must not qualify for public or private insurance (i.e. Medicaid, Medicare, [place] Health Choice, Veterans' Benefits) • Household income should be no more than 200% of the poverty level •The clinic cannot treat you if you are pregnant or think you are pregnant • Sexually transmitted diseases are not treated at the clinic.  ° ° °Dental Care: °Organization         Address  Phone  Notes  °[facility] [facility] [address], [place] (336) 641-6152 Accepts children up to age 21 who are enrolled in Medicaid or [place] Health Choice; pregnant women with a Medicaid card; and children who have  applied for Medicaid or [place] Health Choice, but were declined, whose parents can pay a reduced fee at time of service.  °[facility] [place]  [address], [place] (336) 641-7733 Accepts children up to age 21 who are enrolled in Medicaid or [place] Health Choice; pregnant women with a Medicaid card; and children who have applied for Medicaid or [place] Health Choice, but were declined, whose parents can pay a reduced fee at time of service.  °[facility] ° [address], [place] (336) 641-4533 Patients are seen by appointment only. Walk-ins are not accepted. [facility] will see patients 18 years of age and older. °Monday - Tuesday (8am-5pm) °Most Wednesdays (8:30-5pm) °$30 per visit, cash only  °[facility] ° [address], [place] (336) 641-4533 Patients are seen by appointment only. Walk-ins are not accepted. [facility] will see patients 18 years of age and older. °One Wednesday Evening (Monthly: Volunteer Based).  $30 per visit, cash only  °[facility]  (919) 537-3737 for adults; Children under age 4, call Graduate Pediatric Dentistry at (919) [phone]. Children aged 4-14, please call (919) [phone] to request a pediatric application. ° Dental services are provided in all areas of dental care including fillings, crowns and bridges, complete and partial dentures, implants, gum treatment, root canals, and extractions. Preventive care is also provided. Treatment is provided to both adults and children. °Patients are selected via a lottery and there is often a waiting list. °  °[facility] [address], °[place] ° (336) 763-8833 [email or web address] °  °[facility] [address], [place], [place] (336)723-1848, Ext. 123 Second and Fourth Thursday of each month, opens at 6:30 AM; Clinic ends at 9 AM.  Patients are seen on a first-come first-served basis, and a  limited number are seen during each clinic.  ° °[facility] ° [address], [place], [place] (336) 723-7904   Eligibility Requirements °You must have lived in [place], or [place] counties for at least the last three months. °  You cannot be eligible for state or federal sponsored healthcare insurance, including Veterans Administration, Medicaid, or Medicare. °  You generally cannot be eligible for healthcare insurance through your employer.  °  How to apply: °Eligibility screenings are held every Tuesday and Wednesday afternoon from 1:00 pm until 4:00 pm. You do not need an appointment for the interview!  °[facility] [address], [place], [place] 336-631-2330   °[facility]  336-342-8273   °[facility]  336-703-3100   °[facility]  336-570-6415   ° °Behavioral Health Resources in the Community: °Intensive Outpatient Programs °Organization         Address  Phone  Notes  °[facility] [address], [place], [place] 336-878-6098   °[facility] [address], [place], [place] 336-832-9800   °[facility] [address], [place], [place] ° 336-882-2125   °[facility] [address],  °[place], [place] 1-800-853-5163 or 336-641-4981   °Substance Abuse Resources °Organization         Address  Phone  Notes  °[facility]  336-882-2125   °[facility]  336-784-9470   °[facility]  336-285-9073   °[facility]  336-845-3988   °Residential & Outpatient Substance Abuse Program  1-800-659-3381   °Psychological Services °Organization         Address  Phone  Notes  °[facility]  336- 832-9600   °[facility]  336- 378-7881   °[facility] [address], [place] 1-800-853-5163 or 336-641-4981   ° °Mobile Crisis Teams °Organization          Address  Phone  Notes  °[facility], Mobile Crisis Care Unit  1-877-626-1772   °Assertive °[facility] ° [address] [place], [place] 336-834-9664   °Sharon DeEsch [address] °[place] 336-554-5454   ° °Self-Help/Support Groups °Organization         Address  Phone             Notes  °[facility] - variety of   support groups  336- 373-1402 Call for more information  °Narcotics Anonymous (NA), [facility] [address], °[place]  2 meetings at this location  ° °Residential Treatment Programs °Organization         Address  Phone  Notes  °[facility] [address],    °[place]  1-866-801-8205   °[facility] ° [address], [place], [place] 704-293-8524   °[facility] [address], [place] 336-845-3988 Admissions: 8am-3pm M-F  °[facility] [address]    °[place], [place] 336-841-1104   °[facility] [address], [place], [place] 336-379-7146   °[facility] [address]  °[place], [place] 336-285-9073   °[facility] - Intensive Outpatient [address], [place], [place] 336-852-3033   °[facility] [address]  °[place], [place] 1-877-615-2722 or 336-784-9470   °[facility] [address] [place], [place] 336-227-7417 Accepts Medicaid  °[facility] [address]  °[place] 1-800-659-3381 Substance Abuse/Addiction Treatment  ° °[place] Behavioral Health Resources °Organization         Address  Phone  Notes  °[facility]  (888) 581-9988   °Julie Brannon, PhD [address] [place], [place]   (336) 349-5553 or (336) 951-0000   °[facility]   [address] °[place], [place] (336) 349-4454   °[facility] [address], [place], [place] (336) 342-8316 Insurance/Medicaid/sponsorship  through [facility]  °[facility] [address]                                    [place], [place] (336) 342-8316 Therapy/tele-psych/case  °[facility] [address]  ° [place],  (336) 349-2233    °Dr. Arfeen  (336) 349-4544   °[facility] [place]  [employer] [place] [facility] 1) [address],  °2) [address], [place] °3)  [address], [place] (336) 349-3220 °(336) 342-7768 ° °(336) 342-8140   °[place] Child Abuse [facility] (336) 342-1394 or (336) 342-3537 (After Hours)    ° °

## 2014-02-07 NOTE — ED Notes (Signed)
Pt stated that she wanted to leave because her pain is so bad. Pt stated that she is driving. PA advised of pts severe pain. Toradol 30mg  IM ordered

## 2014-02-09 LAB — CULTURE, GROUP A STREP

## 2014-02-11 NOTE — ED Provider Notes (Signed)
Medical screening examination/treatment/procedure(s) were performed by non-physician practitioner and as supervising physician I was immediately available for consultation/collaboration.  Haeleigh Streiff T Keziah Avis, MD 02/11/14 2015 

## 2017-02-02 ENCOUNTER — Encounter (HOSPITAL_COMMUNITY): Payer: Self-pay | Admitting: Emergency Medicine

## 2017-02-02 ENCOUNTER — Emergency Department (HOSPITAL_COMMUNITY): Payer: Managed Care, Other (non HMO)

## 2017-02-02 ENCOUNTER — Emergency Department (HOSPITAL_COMMUNITY)
Admission: EM | Admit: 2017-02-02 | Discharge: 2017-02-02 | Disposition: A | Discharge status: Home / Self Care | Payer: Managed Care, Other (non HMO) | Attending: Emergency Medicine | Admitting: Emergency Medicine

## 2017-02-02 DIAGNOSIS — R1084 Generalized abdominal pain: Secondary | ICD-10-CM | POA: Insufficient documentation

## 2017-02-02 DIAGNOSIS — R1032 Left lower quadrant pain: Secondary | ICD-10-CM | POA: Diagnosis present

## 2017-02-02 LAB — COMPREHENSIVE METABOLIC PANEL
ALBUMIN: 4.5 g/dL (ref 3.5–5.0)
ALK PHOS: 48 U/L (ref 38–126)
ALT: 41 U/L (ref 14–54)
ANION GAP: 10 (ref 5–15)
AST: 37 U/L (ref 15–41)
BILIRUBIN TOTAL: 0.6 mg/dL (ref 0.3–1.2)
BUN: 12 mg/dL (ref 6–20)
CALCIUM: 9.1 mg/dL (ref 8.9–10.3)
CO2: 24 mmol/L (ref 22–32)
CREATININE: 1.02 mg/dL — AB (ref 0.44–1.00)
Chloride: 105 mmol/L (ref 101–111)
GFR calc Af Amer: >60 mL/min (ref >60)
GFR calc non Af Amer: >60 mL/min (ref >60)
GLUCOSE: 153 mg/dL — AB (ref 65–99)
Potassium: 3.8 mmol/L (ref 3.5–5.1)
SODIUM: 139 mmol/L (ref 135–145)
TOTAL PROTEIN: 7.7 g/dL (ref 6.5–8.1)

## 2017-02-02 LAB — CBC
HCT: 36.3 % (ref 36.0–46.0)
HEMOGLOBIN: 12.2 g/dL (ref 12.0–15.0)
MCH: 28.8 pg (ref 26.0–34.0)
MCHC: 33.6 g/dL (ref 30.0–36.0)
MCV: 85.8 fL (ref 78.0–100.0)
PLATELETS: 230 10*3/uL (ref 150–400)
RBC: 4.23 MIL/uL (ref 3.87–5.11)
RDW: 13.5 % (ref 11.5–15.5)
WBC: 8 10*3/uL (ref 4.0–10.5)

## 2017-02-02 LAB — I-STAT BETA HCG BLOOD, ED (MC, WL, AP ONLY)

## 2017-02-02 LAB — LIPASE, BLOOD: Lipase: 12 U/L (ref 11–51)

## 2017-02-02 MED ORDER — SODIUM CHLORIDE 0.9 % IV BOLUS (SEPSIS)
1000.0000 mL | Freq: Once | INTRAVENOUS | Status: AC
  Administered 2017-02-02: 1000 mL via INTRAVENOUS

## 2017-02-02 MED ORDER — ONDANSETRON HCL 4 MG PO TABS: 4.0000 mg | ORAL_TABLET | Freq: Four times a day (QID) | ORAL | 0 refills | Status: DC

## 2017-02-02 MED ORDER — ONDANSETRON 4 MG PO TBDP
4.0000 mg | ORAL_TABLET | Freq: Once | ORAL | Status: AC | PRN
  Administered 2017-02-02: 4 mg via ORAL
  Filled 2017-02-02: qty 1

## 2017-02-02 MED ORDER — DICYCLOMINE HCL 20 MG PO TABS: 20.0000 mg | ORAL_TABLET | Freq: Two times a day (BID) | ORAL | 0 refills | Status: DC

## 2017-02-02 MED ORDER — IOPAMIDOL (ISOVUE-300) INJECTION 61%
INTRAVENOUS | Status: AC
  Administered 2017-02-02: 100 mL
  Filled 2017-02-02: qty 100

## 2017-02-02 MED ORDER — MORPHINE SULFATE (PF) 4 MG/ML IV SOLN
4.0000 mg | Freq: Once | INTRAVENOUS | Status: AC
  Administered 2017-02-02: 4 mg via INTRAVENOUS
  Filled 2017-02-02: qty 1

## 2017-02-02 MED ORDER — ONDANSETRON HCL 4 MG/2ML IJ SOLN
4.0000 mg | Freq: Once | INTRAMUSCULAR | Status: AC
  Administered 2017-02-02: 4 mg via INTRAVENOUS
  Filled 2017-02-02: qty 2

## 2017-02-02 NOTE — ED Notes (Signed)
Pt ambulatory and independent at discharge.  Verbalized understanding of discharge instructions 

## 2017-02-02 NOTE — ED Notes (Signed)
Patient transported to CT 

## 2017-02-02 NOTE — Discharge Instructions (Signed)

## 2017-02-02 NOTE — ED Provider Notes (Signed)
Emergency Department Provider Note   I have reviewed the triage vital signs and the nursing notes.   HISTORY  Chief Complaint Abdominal Pain   HPI Marie George is a 36 y.o. female with PMH of EtOH associate gastritis presents to the emergency department for evaluation of sudden onset left lower quadrant abdominal pain. Patient states began this morning associated with vomiting and diarrhea. She denies fever. No blood in her emesis or stool. She denies chest pain, difficulty breathing, back pain. No dysuria. She denies vaginal bleeding or discharge. She denies history of similar pain in the past. She did have 1 alcoholic beverage last night denies more than one drink. No other drug use. No sick contacts. The pain in the LLQ is sharp, severe, non-radiating, and with no exacerbating or alleviating factors.    History reviewed. No pertinent past medical history.  There are no active problems to display for this patient.   History reviewed. No pertinent surgical history.  Current Outpatient Rx  . Order #: [id number] Class: Print  . Order #: [id number] Class: Print  . Order #: [id number] Class: Print  . Order #: [id number] Class: Historical Med  . Order #: [id number] Class: Print  . Order #: [id number] Class: Print    Allergies Patient has no known allergies.  No family history on file.  Social History Social History  Substance Use Topics  . Smoking status: Never Smoker  . Smokeless tobacco: Never Used  . Alcohol use Yes    Review of Systems  Constitutional: No fever/chills Eyes: No visual changes. ENT: No sore throat. Cardiovascular: Denies chest pain. Respiratory: Denies shortness of breath. Gastrointestinal: Positive LLQ abdominal pain. Positive nausea, vomiting, and diarrhea.  No constipation. Genitourinary: Negative for dysuria. Musculoskeletal: Negative for back pain. Skin: Negative for rash. Neurological: Negative for headaches, focal weakness or numbness.  10-point  ROS otherwise negative.  ____________________________________________   PHYSICAL EXAM:  VITAL SIGNS: ED Triage Vitals [02/02/17 1507]  Enc Vitals Group     BP (!) 137/104     Pulse Rate 93     Resp 22     Temp 99.2 F (37.3 C)     Temp Source Oral     SpO2 97 %   Constitutional: Patient is crying and rolling around on the bed. Appears uncomfortable.  Eyes: Conjunctivae are normal.  Head: Atraumatic. Nose: No congestion/rhinnorhea. Mouth/Throat: Mucous membranes are moist.  Oropharynx non-erythematous. Neck: No stridor.   Cardiovascular: Normal rate, regular rhythm. Good peripheral circulation. Grossly normal heart sounds.   Respiratory: Normal respiratory effort.  No retractions. Lungs CTAB. Gastrointestinal: Soft but with diffuse tenderness. No guarding. No rebound. No distention.  Musculoskeletal: No lower extremity tenderness nor edema. No gross deformities of extremities. Neurologic:  No gross focal neurologic deficits are appreciated.  Skin:  Skin is warm, dry and intact. No rash noted.  ____________________________________________   LABS (all labs ordered are listed, but only abnormal results are displayed)  Labs Reviewed  COMPREHENSIVE METABOLIC PANEL - Abnormal; Notable for the following:       Result Value   Glucose, Bld 153 (*)    Creatinine, Ser 1.02 (*)    All other components within normal limits  LIPASE, BLOOD  CBC  I-STAT BETA HCG BLOOD, ED ([facility], [facility], [facility] ONLY)   ____________________________________________  RADIOLOGY  Ct Abdomen Pelvis W Contrast  Result Date: 02/02/2017 CLINICAL DATA:  Left lower quadrant abdominal pain and vomiting. EXAM: CT ABDOMEN AND PELVIS WITH CONTRAST TECHNIQUE: Multidetector CT imaging of the abdomen and pelvis  was performed using the standard protocol following bolus administration of intravenous contrast. CONTRAST:  100mL ISOVUE-300 IOPAMIDOL (ISOVUE-300) INJECTION 61% COMPARISON:  06/09/2016 FINDINGS: Lower chest: The lung  bases are clear of acute process. No pleural effusion or pulmonary lesions. The heart is normal in size. No pericardial effusion. The distal esophagus and aorta are unremarkable. Hepatobiliary: No focal hepatic lesions or intrahepatic biliary dilatation. The gallbladder is normal. No common bile duct dilatation. Pancreas: No mass, inflammation or ductal dilatation. Spleen: Normal size.  No focal lesions. Adrenals/Urinary Tract: The adrenal glands and kidneys are unremarkable. No renal lesions, renal calculi or hydronephrosis. No ureteral or bladder calculi. Stomach/Bowel: The stomach, duodenum, small bowel and colon are grossly normal. No inflammatory changes, mass lesions or obstructive findings. The terminal ileum is normal. The appendix is not identified for certain. Vascular/Lymphatic: The aorta is normal in caliber. No dissection. The branch vessels are patent. The major venous structures are patent. No mesenteric or retroperitoneal mass or adenopathy. Small scattered lymph nodes are noted. Reproductive: The uterus and ovaries are unremarkable. Other: No pelvic mass or adenopathy. No free pelvic fluid collections. No inguinal mass or adenopathy. No abdominal wall hernia or subcutaneous lesions. Musculoskeletal: No significant bony findings. IMPRESSION: No acute abdominal/ pelvic findings, mass lesions or adenopathy. Electronically Signed   By: Rudie MeyerP.  Gallerani M.D.   On: 02/02/2017 16:58    ____________________________________________   PROCEDURES  Procedure(s) performed:   Procedures  None ____________________________________________   INITIAL IMPRESSION / ASSESSMENT AND PLAN / ED COURSE  Pertinent labs & imaging results that were available during my care of the patient were reviewed by me and considered in my medical decision making (see chart for details).  Patient resents to the emergency department with acute onset left lower quadrant abdominal pain associated vomiting and diarrhea. He  appears very uncomfortable and is crying frequently shifting in the bed. Her abdomen is soft with no guarding. Low suspicion for acute abdomen patient is very comfortable. We'll establish IV access and treat with pain and nausea medication while labs are pending. Level of apparent discomfort I feel obligated to repeat a CT scan. CT scan from 2012 reviewed with no acute findings. At that time the patient was having epigastric discomfort that was thought to be secondary to alcohol use and subsequent gastritis. Also considered pelvic etiology for the patient's pain but given her associated vomiting and diarrhea with diffuse abdominal discomfort on exam I feel this is less likely.   05:49 PM Patient feeling much better. Normal CT abdomen/pelvis. No dysuria, hesitancy, or urgency. No fever or other concern for UTI. Patient would like to return home at this time and is tolerating PO without difficulty. Cancelled UA and will discharge. Normal adnexa on CT with no clinical concern for torsion.   At this time, I do not feel there is any life-threatening condition present. I have reviewed and discussed all results (EKG, imaging, lab, urine as appropriate), exam findings with patient. I have reviewed nursing notes and appropriate previous records.  I feel the patient is safe to be discharged home without further emergent workup. Discussed usual and customary return precautions. Patient and family (if present) verbalize understanding and are comfortable with this plan.  Patient will follow-up with their primary care provider. If they do not have a primary care provider, information for follow-up has been provided to them. All questions have been answered.  ____________________________________________  FINAL CLINICAL IMPRESSION(S) / ED DIAGNOSES  Final diagnoses:  Generalized abdominal pain  MEDICATIONS GIVEN DURING THIS VISIT:  Medications  ondansetron (ZOFRAN-ODT) disintegrating tablet 4 mg (4 mg Oral  Given 02/02/17 1506)  sodium chloride 0.9 % bolus 1,000 mL (0 mLs Intravenous Stopped 02/02/17 1804)  morphine 4 MG/ML injection 4 mg (4 mg Intravenous Given 02/02/17 1552)  ondansetron (ZOFRAN) injection 4 mg (4 mg Intravenous Given 02/02/17 1552)  morphine 4 MG/ML injection 4 mg (4 mg Intravenous Given 02/02/17 1621)  iopamidol (ISOVUE-300) 61 % injection (100 mLs  Contrast Given 02/02/17 1640)     NEW OUTPATIENT MEDICATIONS STARTED DURING THIS VISIT:  Discharge Medication List as of 02/02/2017  5:52 PM    START taking these medications   Details  dicyclomine (BENTYL) 20 MG tablet Take 1 tablet (20 mg total) by mouth 2 (two) times daily., Starting Sun 02/02/2017, Print    ondansetron (ZOFRAN) 4 MG tablet Take 1 tablet (4 mg total) by mouth every 6 (six) hours., Starting Sun 02/02/2017, Print        Note:  This document was prepared using Dragon voice recognition software and may include unintentional dictation errors.  Alona Bene, MD Emergency Medicine   Maia Plan, MD 02/03/17 [phone]

## 2017-02-02 NOTE — ED Triage Notes (Signed)
Patient c/o LLQ pain and vomiting that started this morning.  Patient denies diarrhea.

## 2018-01-03 ENCOUNTER — Other Ambulatory Visit: Payer: Self-pay

## 2018-01-03 ENCOUNTER — Encounter (HOSPITAL_BASED_OUTPATIENT_CLINIC_OR_DEPARTMENT_OTHER): Payer: Self-pay | Admitting: Emergency Medicine

## 2018-01-03 ENCOUNTER — Emergency Department (HOSPITAL_BASED_OUTPATIENT_CLINIC_OR_DEPARTMENT_OTHER)
Admission: EM | Admit: 2018-01-03 | Discharge: 2018-01-03 | Disposition: A | Discharge status: Home / Self Care | Payer: 59 | Attending: Emergency Medicine | Admitting: Emergency Medicine

## 2018-01-03 DIAGNOSIS — A599 Trichomoniasis, unspecified: Secondary | ICD-10-CM

## 2018-01-03 DIAGNOSIS — R1084 Generalized abdominal pain: Secondary | ICD-10-CM | POA: Diagnosis not present

## 2018-01-03 DIAGNOSIS — Z79899 Other long term (current) drug therapy: Secondary | ICD-10-CM | POA: Diagnosis not present

## 2018-01-03 DIAGNOSIS — R109 Unspecified abdominal pain: Secondary | ICD-10-CM | POA: Diagnosis present

## 2018-01-03 DIAGNOSIS — R112 Nausea with vomiting, unspecified: Secondary | ICD-10-CM

## 2018-01-03 DIAGNOSIS — R197 Diarrhea, unspecified: Secondary | ICD-10-CM

## 2018-01-03 LAB — WET PREP, GENITAL
Clue Cells Wet Prep HPF POC: NONE SEEN
Sperm: NONE SEEN
Yeast Wet Prep HPF POC: NONE SEEN

## 2018-01-03 LAB — COMPREHENSIVE METABOLIC PANEL
ALT: 32 U/L (ref 14–54)
AST: 39 U/L (ref 15–41)
Albumin: 4.7 g/dL (ref 3.5–5.0)
Alkaline Phosphatase: 55 U/L (ref 38–126)
Anion gap: 11 (ref 5–15)
BILIRUBIN TOTAL: 0.4 mg/dL (ref 0.3–1.2)
BUN: 10 mg/dL (ref 6–20)
CO2: 23 mmol/L (ref 22–32)
CREATININE: 0.99 mg/dL (ref 0.44–1.00)
Calcium: 9.5 mg/dL (ref 8.9–10.3)
Chloride: 103 mmol/L (ref 101–111)
GFR calc Af Amer: >60 mL/min (ref >60)
Glucose, Bld: 152 mg/dL — ABNORMAL HIGH (ref 65–99)
POTASSIUM: 3.6 mmol/L (ref 3.5–5.1)
Sodium: 137 mmol/L (ref 135–145)
TOTAL PROTEIN: 8.2 g/dL — AB (ref 6.5–8.1)

## 2018-01-03 LAB — URINALYSIS, ROUTINE W REFLEX MICROSCOPIC
Bilirubin Urine: NEGATIVE
Glucose, UA: NEGATIVE mg/dL
Hgb urine dipstick: NEGATIVE
KETONES UR: 15 mg/dL — AB
Leukocytes, UA: NEGATIVE
NITRITE: NEGATIVE
PROTEIN: NEGATIVE mg/dL
Specific Gravity, Urine: 1.02 (ref 1.005–1.030)
pH: 7.5 (ref 5.0–8.0)

## 2018-01-03 LAB — HCG, QUANTITATIVE, PREGNANCY: hCG, Beta Chain, Quant, S: <1 m[IU]/mL (ref <5)

## 2018-01-03 LAB — CBC
HCT: 38.8 % (ref 36.0–46.0)
Hemoglobin: 13.2 g/dL (ref 12.0–15.0)
MCH: 29.7 pg (ref 26.0–34.0)
MCHC: 34 g/dL (ref 30.0–36.0)
MCV: 87.2 fL (ref 78.0–100.0)
PLATELETS: 228 10*3/uL (ref 150–400)
RBC: 4.45 MIL/uL (ref 3.87–5.11)
RDW: 12.6 % (ref 11.5–15.5)
WBC: 7.4 10*3/uL (ref 4.0–10.5)

## 2018-01-03 LAB — LIPASE, BLOOD: Lipase: 17 U/L (ref 11–51)

## 2018-01-03 MED ORDER — FENTANYL CITRATE (PF) 100 MCG/2ML IJ SOLN
50.0000 ug | INTRAMUSCULAR | Status: AC | PRN
  Administered 2018-01-03 (×2): 50 ug via INTRAVENOUS
  Filled 2018-01-03 (×2): qty 2

## 2018-01-03 MED ORDER — MORPHINE SULFATE (PF) 4 MG/ML IV SOLN
4.0000 mg | Freq: Once | INTRAVENOUS | Status: AC
  Administered 2018-01-03: 4 mg via INTRAVENOUS
  Filled 2018-01-03: qty 1

## 2018-01-03 MED ORDER — SODIUM CHLORIDE 0.9 % IV BOLUS (SEPSIS)
1000.0000 mL | Freq: Once | INTRAVENOUS | Status: AC
  Administered 2018-01-03: 1000 mL via INTRAVENOUS

## 2018-01-03 MED ORDER — ONDANSETRON HCL 4 MG/2ML IJ SOLN
4.0000 mg | Freq: Once | INTRAMUSCULAR | Status: AC
  Administered 2018-01-03: 4 mg via INTRAVENOUS
  Filled 2018-01-03: qty 2

## 2018-01-03 MED ORDER — DICYCLOMINE HCL 20 MG PO TABS: 20.0000 mg | ORAL_TABLET | Freq: Two times a day (BID) | ORAL | 0 refills | Status: AC

## 2018-01-03 MED ORDER — ONDANSETRON HCL 4 MG PO TABS: 4.0000 mg | ORAL_TABLET | Freq: Four times a day (QID) | ORAL | 0 refills | Status: AC

## 2018-01-03 MED ORDER — METRONIDAZOLE 500 MG PO TABS
2000.0000 mg | ORAL_TABLET | Freq: Once | ORAL | Status: AC
  Administered 2018-01-03: 2000 mg via ORAL
  Filled 2018-01-03: qty 4

## 2018-01-03 MED ORDER — DICYCLOMINE HCL 10 MG/ML IM SOLN
20.0000 mg | Freq: Once | INTRAMUSCULAR | Status: AC
  Administered 2018-01-03: 20 mg via INTRAMUSCULAR
  Filled 2018-01-03: qty 2

## 2018-01-03 MED ORDER — LORAZEPAM 2 MG/ML IJ SOLN
0.5000 mg | Freq: Once | INTRAMUSCULAR | Status: AC
  Administered 2018-01-03: 0.5 mg via INTRAVENOUS
  Filled 2018-01-03: qty 1

## 2018-01-03 MED ORDER — PANTOPRAZOLE SODIUM 20 MG PO TBEC: 20.0000 mg | DELAYED_RELEASE_TABLET | Freq: Every day | ORAL | 0 refills | Status: AC

## 2018-01-03 MED ORDER — ONDANSETRON 4 MG PO TBDP
4.0000 mg | ORAL_TABLET | Freq: Once | ORAL | Status: AC | PRN
  Administered 2018-01-03: 4 mg via ORAL
  Filled 2018-01-03: qty 1

## 2018-01-03 NOTE — ED Notes (Signed)
Patient has a wristband on from [facility]. States that she sat there for over an hour and she was not seen. Denies any Blood draws or treatment there. The patient given ODT zofran and encouraged to take deep breaths. Patient continues to moan and writh around in the chair in pain screaming - " I cant tolerate pain"

## 2018-01-03 NOTE — ED Triage Notes (Signed)
Patient states that she is having diffuse pain to her abdominal region that started this am that woke her up. The patient is having N/V/D  - patient is writhing around in pain and crying. Very anxious

## 2018-01-03 NOTE — ED Notes (Signed)
Explained to pt need for urine specimen and cooperation with ordered test and procedures to rule out the causes for the abdominal pain that she is experiencing.

## 2018-01-03 NOTE — ED Notes (Signed)
ED Provider at bedside. 

## 2018-01-03 NOTE — ED Notes (Signed)
Pt writhing in bed, c/o pain and nausea. RN aware. Pt unable to void at this time.

## 2018-01-03 NOTE — ED Notes (Signed)
Pt refused pelvic exam at this time.

## 2018-01-03 NOTE — ED Provider Notes (Signed)
[facility] EMERGENCY DEPARTMENT Provider Note   CSN: [id number] Arrival date & time: 01/03/18  1509     History   Chief Complaint Chief Complaint  Patient presents with  . Abdominal Pain    HPI Marie George is a 37 y.o. femalewho presents with a one-day history of abdominal pain, nausea, vomiting, and diarrhea. Patient reports her symptoms started suddenly. She describes her abdominal pain is a mix of cramping and sharp pains. It is worse in the upper abdomen, however does radiate to her lower abdomen. It does not radiate to her back. She denies any urinary symptoms. She has had a new vaginal discharge, but denies any abnormal vaginal bleeding. LMP 12/27/2017. Patient did take Pepto-Bismol at home which she vomited.  Patient reports she has been taking more NSAIDs recently for headaches.  HPI  History reviewed. No pertinent past medical history.  There are no active problems to display for this patient.   History reviewed. No pertinent surgical history.  OB History    No data available       Home Medications    Prior to Admission medications   Medication Sig Start Date End Date Taking? Authorizing Provider  benzonatate (TESSALON) 100 MG capsule Take 2 capsules (200 mg total) by mouth 2 (two) times daily as needed for cough. 02/07/14   Mellody Drown, PA-C  dicyclomine (BENTYL) 20 MG tablet Take 1 tablet (20 mg total) by mouth 2 (two) times daily. 01/03/18   Christiano Blandon, Waylan Boga, PA-C  fluticasone (FLONASE) 50 MCG/ACT nasal spray Place 2 sprays into both nostrils daily. 02/07/14 02/07/14  Mellody Drown, PA-C  HYDROcodone-acetaminophen (NORCO/VICODIN) 5-325 MG per tablet Take 1 tablet by mouth every 4 (four) hours as needed for moderate pain.    [provider]  ondansetron (ZOFRAN) 4 MG tablet Take 1 tablet (4 mg total) by mouth every 6 (six) hours. 01/03/18   Arul Farabee, Waylan Boga, PA-C  oxymetazoline (AFRIN NASAL SPRAY) 0.05 % nasal spray Place 1 spray into both  nostrils 2 (two) times daily. 02/07/14   Mellody Drown, PA-C  pantoprazole (PROTONIX) 20 MG tablet Take 1 tablet (20 mg total) by mouth daily. 01/03/18   Emi Holes, PA-C    Family History History reviewed. No pertinent family history.  Social History Social History   Tobacco Use  . Smoking status: Never Smoker  . Smokeless tobacco: Never Used  Substance Use Topics  . Alcohol use: Yes  . Drug use: No     Allergies   Patient has no known allergies.   Review of Systems Review of Systems  Constitutional: Negative for chills and fever.  HENT: Negative for facial swelling and sore throat.   Respiratory: Negative for shortness of breath.   Cardiovascular: Negative for chest pain.  Gastrointestinal: Positive for abdominal pain, diarrhea, nausea and vomiting. Negative for blood in stool.  Genitourinary: Positive for vaginal discharge. Negative for dysuria and vaginal bleeding.  Musculoskeletal: Negative for back pain.  Skin: Negative for rash and wound.  Neurological: Negative for headaches.  Psychiatric/Behavioral: The patient is not nervous/anxious.      Physical Exam Updated Vital Signs BP 123/82   Pulse 73   Temp 98.1 F (36.7 C) (Oral)   Resp 18   Ht 5\' 7"  (1.702 m)   Wt 81.6 kg (180 lb)   LMP 12/27/2017   SpO2 99%   BMI 28.19 kg/m   Physical Exam  Constitutional: She appears well-developed and well-nourished. No distress.  Patient clearly uncomfortable,  moaning in pain Wretching  HENT:  Head: Normocephalic and atraumatic.  Mouth/Throat: Oropharynx is clear and moist. No oropharyngeal exudate.  Eyes: Conjunctivae are normal. Pupils are equal, round, and reactive to light. Right eye exhibits no discharge. Left eye exhibits no discharge. No scleral icterus.  Neck: Normal range of motion. Neck supple. No thyromegaly present.  Cardiovascular: Normal rate, regular rhythm, normal heart sounds and intact distal pulses. Exam reveals no gallop and no friction rub.    No murmur heard. Pulmonary/Chest: Effort normal and breath sounds normal. No stridor. No respiratory distress. She has no wheezes. She has no rales.  Abdominal: Soft. Bowel sounds are normal. She exhibits no distension. There is generalized tenderness. There is positive Murphy's sign. There is no rebound, no guarding and no tenderness at McBurney's point.  Musculoskeletal: She exhibits no edema.  Lymphadenopathy:    She has no cervical adenopathy.  Neurological: She is alert. Coordination normal.  Skin: Skin is warm and dry. No rash noted. She is not diaphoretic. No pallor.  Psychiatric: She has a normal mood and affect.  Nursing note and vitals reviewed.    ED Treatments / Results  Labs (all labs ordered are listed, but only abnormal results are displayed) Labs Reviewed  WET PREP, GENITAL - Abnormal; Notable for the following components:      Result Value   Trich, Wet Prep PRESENT (*)    WBC, Wet Prep HPF POC MANY (*)    All other components within normal limits  COMPREHENSIVE METABOLIC PANEL - Abnormal; Notable for the following components:   Glucose, Bld 152 (*)    Total Protein 8.2 (*)    All other components within normal limits  URINALYSIS, ROUTINE W REFLEX MICROSCOPIC - Abnormal; Notable for the following components:   APPearance HAZY (*)    Ketones, ur 15 (*)    All other components within normal limits  LIPASE, BLOOD  CBC  HCG, QUANTITATIVE, PREGNANCY  GC/CHLAMYDIA PROBE AMP ([place]) NOT AT [facility]    EKG  EKG Interpretation None       Radiology No results found.  Procedures Procedures (including critical care time)  Medications Ordered in ED Medications  ondansetron (ZOFRAN-ODT) disintegrating tablet 4 mg (4 mg Oral Given 01/03/18 1529)  fentaNYL (SUBLIMAZE) injection 50 mcg (50 mcg Intravenous Given 01/03/18 1716)  sodium chloride 0.9 % bolus 1,000 mL (0 mLs Intravenous Stopped 01/03/18 1907)  ondansetron (ZOFRAN) injection 4 mg (4 mg Intravenous Given  01/03/18 1713)  dicyclomine (BENTYL) injection 20 mg (20 mg Intramuscular Given 01/03/18 1717)  morphine 4 MG/ML injection 4 mg (4 mg Intravenous Given 01/03/18 1820)  LORazepam (ATIVAN) injection 0.5 mg (0.5 mg Intravenous Given 01/03/18 1903)  sodium chloride 0.9 % bolus 1,000 mL (0 mLs Intravenous Stopped 01/03/18 2028)  metroNIDAZOLE (FLAGYL) tablet 2,000 mg (2,000 mg Oral Given 01/03/18 2100)     Initial Impression / Assessment and Plan / ED Course  I have reviewed the triage vital signs and the nursing notes.  Pertinent labs & imaging results that were available during my care of the patient were reviewed by me and considered in my medical decision making (see chart for details).     Patient with suspected viral syndrome causing nausea, vomiting, diarrhea, and abdominal cramping.  Patient also with trichomonas.  Labs are otherwise unremarkable.  GC/chlamydia sent and pending.  Pelvic exam is benign other than some scant discharge.  Urine shows 15 ketones, however otherwise unremarkable.  Patient is feeling much better in the  ED after Zofran, Bentyl, morphine, and initial fentanyl in triage.  Patient's repeat abdominal exam is nontender.  Patient presented with very similar symptoms 1 year ago and other times in the past and had several CT abdomen pelvis completed with negative findings.  I do not feel CT is indicated at this time considering patient repeat abdominal exam is benign and nontender.  Will discharge home with Bentyl, Zofran, and Protonix.  Patient advised to avoid NSAIDs, she states she has been taking them a little more often.  This could be related to her vomiting as well.  Return precautions discussed.  Patient understands and agrees with plan.  Patient vitals stable and discharged in satisfactory condition.  Final Clinical Impressions(s) / ED Diagnoses   Final diagnoses:  Nausea vomiting and diarrhea  Generalized abdominal pain  Trichomonas infection    ED Discharge Orders         Ordered    ondansetron (ZOFRAN) 4 MG tablet  Every 6 hours     01/03/18 2056    dicyclomine (BENTYL) 20 MG tablet  2 times daily     01/03/18 2056    pantoprazole (PROTONIX) 20 MG tablet  Daily     01/03/18 2059       Emi HolesLaw, Mystery Schrupp M, [place]PA-C 01/04/18 1548    Tilden Fossaees, Elizabeth, MD 01/05/18 [phone]

## 2018-01-03 NOTE — Discharge Instructions (Signed)
Medications: Zofran, Bentyl, Protonix  Treatment: Take Zofran every 6 hours as needed for nausea or vomiting.  Take Bentyl twice daily as needed for abdominal cramping.  Avoid NSAID medication such as ibuprofen and Aleve.  You may have developed an ulcer or gastritis from the NSAIDs as you have been taking lately.  Take Protonix once daily as prescribed for this.  Follow-up: Please follow-up with a GI doctor upon return home.  You will be called in 3 days if positive for gonorrhea or chlamydia.  If positive, please follow-up with your doctor or the health department for treatment.  At that time, please make sure all of your sexual partners are treated for trichomonas as well as gonorrhea chlamydia, if positive.  Please return to emergency department if you develop any new or worsening symptoms including localized abdominal pain, intractable vomiting, or any other new or concerning symptoms.

## 2018-01-05 LAB — GC/CHLAMYDIA PROBE AMP (~~LOC~~) NOT AT ARMC
Chlamydia: NEGATIVE
Neisseria Gonorrhea: NEGATIVE
